# Patient Record
Sex: Male | Born: 1955 | Race: White | Hispanic: No | Marital: Married | Smoking: Never smoker
Health system: Southern US, Community
[De-identification: ages and names within clinical notes are randomized; demographics above are authoritative.]

## PROBLEM LIST (undated history)

## (undated) DIAGNOSIS — E7800 Pure hypercholesterolemia, unspecified: Secondary | ICD-10-CM

## (undated) HISTORY — PX: ROTATOR CUFF REPAIR: SHX139

---

## 2006-11-15 ENCOUNTER — Ambulatory Visit: Payer: Self-pay | Admitting: Gastroenterology

## 2008-01-02 ENCOUNTER — Ambulatory Visit: Payer: Self-pay

## 2008-03-08 ENCOUNTER — Ambulatory Visit: Payer: Self-pay | Admitting: Orthopaedic Surgery

## 2011-06-20 ENCOUNTER — Ambulatory Visit: Payer: Self-pay

## 2011-10-29 ENCOUNTER — Ambulatory Visit: Payer: Self-pay | Admitting: Orthopedic Surgery

## 2015-05-14 DIAGNOSIS — E7800 Pure hypercholesterolemia, unspecified: Secondary | ICD-10-CM | POA: Insufficient documentation

## 2015-08-08 ENCOUNTER — Encounter: Payer: Self-pay | Admitting: *Deleted

## 2015-08-09 ENCOUNTER — Ambulatory Visit
Admission: RE | Admit: 2015-08-09 | Discharge: 2015-08-09 | Disposition: A | Discharge status: Home / Self Care | Payer: BLUE CROSS/BLUE SHIELD | Source: Ambulatory Visit | Attending: Gastroenterology | Admitting: Gastroenterology

## 2015-08-09 ENCOUNTER — Ambulatory Visit: Payer: BLUE CROSS/BLUE SHIELD | Admitting: Anesthesiology

## 2015-08-09 ENCOUNTER — Encounter
Admission: RE | Disposition: A | Discharge status: Home / Self Care | Payer: Self-pay | Source: Ambulatory Visit | Attending: Gastroenterology

## 2015-08-09 ENCOUNTER — Encounter: Payer: Self-pay | Admitting: *Deleted

## 2015-08-09 DIAGNOSIS — E7800 Pure hypercholesterolemia, unspecified: Secondary | ICD-10-CM | POA: Diagnosis not present

## 2015-08-09 DIAGNOSIS — Z1211 Encounter for screening for malignant neoplasm of colon: Secondary | ICD-10-CM | POA: Diagnosis present

## 2015-08-09 DIAGNOSIS — K5730 Diverticulosis of large intestine without perforation or abscess without bleeding: Secondary | ICD-10-CM | POA: Diagnosis not present

## 2015-08-09 DIAGNOSIS — Z8371 Family history of colonic polyps: Secondary | ICD-10-CM | POA: Diagnosis not present

## 2015-08-09 HISTORY — DX: Pure hypercholesterolemia, unspecified: E78.00

## 2015-08-09 HISTORY — PX: COLONOSCOPY WITH PROPOFOL: SHX5780

## 2015-08-09 SURGERY — COLONOSCOPY WITH PROPOFOL
Anesthesia: General

## 2015-08-09 MED ORDER — PROPOFOL 500 MG/50ML IV EMUL
INTRAVENOUS | Status: DC | PRN
  Administered 2015-08-09: 180 ug/kg/min via INTRAVENOUS

## 2015-08-09 MED ORDER — SODIUM CHLORIDE 0.9 % IV SOLN
INTRAVENOUS | Status: DC
  Administered 2015-08-09: 1000 mL via INTRAVENOUS

## 2015-08-09 MED ORDER — LIDOCAINE HCL (CARDIAC) 20 MG/ML IV SOLN
INTRAVENOUS | Status: DC | PRN
  Administered 2015-08-09: 40 mg via INTRAVENOUS

## 2015-08-09 MED ORDER — SODIUM CHLORIDE 0.9 % IV SOLN: INTRAVENOUS | Status: DC

## 2015-08-09 MED ORDER — PROPOFOL 10 MG/ML IV BOLUS
INTRAVENOUS | Status: DC | PRN
  Administered 2015-08-09: 20 mg via INTRAVENOUS
  Administered 2015-08-09: 50 mg via INTRAVENOUS

## 2015-08-09 NOTE — Anesthesia Procedure Notes (Signed)
Performed by: Kniyah Khun Pre-anesthesia Checklist: Patient identified, Emergency Drugs available, Suction available, Patient being monitored and Timeout performed Patient Re-evaluated:Patient Re-evaluated prior to inductionOxygen Delivery Method: Nasal cannula Intubation Type: IV induction       

## 2015-08-09 NOTE — H&P (Signed)
    Primary Care Physician:  No primary care provider on file. Primary Gastroenterologist:  Dr. Bluford Kaufmann  Pre-Procedure History & Physical: HPI:  Barry Wilson. is a 59 y.o. male is here for an colonoscopy.   Past Medical History  Diagnosis Date  . Hypercholesteremia   . Hypercholesterolemia     Past Surgical History  Procedure Laterality Date  . Rotator cuff repair Left     Prior to Admission medications   Medication Sig Start Date End Date Taking? Authorizing Provider  simvastatin (ZOCOR) 40 MG tablet Take 40 mg by mouth daily.   Yes Historical Provider, MD    Allergies as of 06/19/2015  . (Not on File)    History reviewed. No pertinent family history.  Social History   Social History  . Marital Status: Married    Spouse Name: N/A  . Number of Children: N/A  . Years of Education: N/A   Occupational History  . Not on file.   Social History Main Topics  . Smoking status: Never Smoker   . Smokeless tobacco: Never Used  . Alcohol Use: Not on file  . Drug Use: Not on file  . Sexual Activity: Not on file   Other Topics Concern  . Not on file   Social History Narrative    Review of Systems: See HPI, otherwise negative ROS  Physical Exam: BP 118/68 mmHg  Pulse 63  Temp(Src) 96.1 F (35.6 C) (Tympanic)  Resp 16  Ht  (1.676 m)  Wt 88.451 kg (195 lb)  BMI 31.49 kg/m2  SpO2 98% General:   Alert,  pleasant and cooperative in NAD Head:  Normocephalic and atraumatic. Neck:  Supple; no masses or thyromegaly. Lungs:  Clear throughout to auscultation.    Heart:  Regular rate and rhythm. Abdomen:  Soft, nontender and nondistended. Normal bowel sounds, without guarding, and without rebound.   Neurologic:  Alert and  oriented x4;  grossly normal neurologically.  Impression/Plan: Barry Wilson. is here for an colonoscopy to be performed for screening, family hx of colon polyps  Risks, benefits, limitations, and alternatives regarding colonoscopy  have been reviewed with the patient.  Questions have been answered.  All parties agreeable.   Vail Vuncannon, Ezzard Standing, MD  08/09/2015, 7:55 AM

## 2015-08-09 NOTE — Op Note (Signed)
[facility] Patient Name: Barry Wilson Procedure Date: 08/09/2015 7:26 AM MRN: [id number] Account #: [id number] Date of Birth: Aug 03, 1956 Admit Type: Outpatient Age: 59 Room: [facility] ENDO ROOM 4 Gender: Male Note Status: Finalized Procedure:         Colonoscopy Indications:       Colon cancer screening in patient at increased risk:                     Family history of colon polyps Providers:         Ezzard Standing. Bluford Kaufmann, MD Referring MD:      Letta Pate. Danne Harbor, MD (Referring MD) Medicines:         Monitored Anesthesia Care Complications:     No immediate complications. Procedure:         Pre-Anesthesia Assessment:                    - Prior to the procedure, a History and Physical was                     performed, and patient medications, allergies and                     sensitivities were reviewed. The patient's tolerance of                     previous anesthesia was reviewed.                    - The risks and benefits of the procedure and the sedation                     options and risks were discussed with the patient. All                     questions were answered and informed consent was obtained.                    - After reviewing the risks and benefits, the patient was                     deemed in satisfactory condition to undergo the procedure.                    After obtaining informed consent, the colonoscope was                     passed under direct vision. Throughout the procedure, the                     patient's blood pressure, pulse, and oxygen saturations                     were monitored continuously. The Colonoscope was                     introduced through the anus and advanced to the the cecum,                     identified by appendiceal orifice and ileocecal valve. The                     colonoscopy was performed without difficulty. The patient  tolerated the procedure well. The quality of the bowel                      preparation was good. Findings:      A few small-mouthed diverticula were found in the sigmoid colon.      The exam was otherwise without abnormality. Impression:        - Diverticulosis in the sigmoid colon.                    - The examination was otherwise normal.                    - No specimens collected. Recommendation:    - Discharge patient to home.                    - Repeat colonoscopy in 5 years for surveillance.                    - The findings and recommendations were discussed with the                     patient. Procedure Code(s): --- Professional ---                    [phone], Colonoscopy, flexible; diagnostic, including                     collection of specimen(s) by brushing or washing, when                     performed (separate procedure) Diagnosis Code(s): --- Professional ---                    Z83.71, Family history of colonic polyps                    K57.30, Diverticulosis of large intestine without                     perforation or abscess without bleeding CPT copyright 2014 American Medical Association. All rights reserved. The codes documented in this report are preliminary and upon coder review may  be revised to meet current compliance requirements. Wallace Cullens, MD 08/09/2015 8:16:51 AM This report has been signed electronically. Number of Addenda: 0 Note Initiated On: 08/09/2015 7:26 AM Scope Withdrawal Time: 0 hours 6 minutes 18 seconds  Total Procedure Duration: 0 hours 9 minutes 5 seconds       [facility]

## 2015-08-09 NOTE — Anesthesia Postprocedure Evaluation (Signed)
  Anesthesia Post-op Note  Patient: Barry Wilson.  Procedure(s) Performed: Procedure(s): COLONOSCOPY WITH PROPOFOL (N/A)  Anesthesia type:General  Patient location: PACU  Post pain: Pain level controlled  Post assessment: Post-op Vital signs reviewed, Patient's Cardiovascular Status Stable, Respiratory Function Stable, Patent Airway and No signs of Nausea or vomiting  Post vital signs: Reviewed and stable  Last Vitals:  Filed Vitals:   08/09/15 0850  BP: 117/74  Pulse: 53  Temp:   Resp: 14    Level of consciousness: awake, alert  and patient cooperative  Complications: No apparent anesthesia complications

## 2015-08-09 NOTE — Anesthesia Preprocedure Evaluation (Signed)
Anesthesia Evaluation  Patient identified by MRN, date of birth, ID band Patient awake    Reviewed: Allergy & Precautions, H&P , NPO status , Patient's Chart, lab work & pertinent test results  History of Anesthesia Complications Negative for: history of anesthetic complications  Airway Mallampati: III  TM Distance: >3 FB Neck ROM: limited    Dental  (+) Poor Dentition, Chipped, Missing, Upper Dentures, Lower Dentures   Pulmonary neg pulmonary ROS, neg shortness of breath,    Pulmonary exam normal breath sounds clear to auscultation       Cardiovascular Exercise Tolerance: Good (-) Past MI negative cardio ROS Normal cardiovascular exam Rhythm:regular Rate:Normal     Neuro/Psych negative neurological ROS  negative psych ROS   GI/Hepatic negative GI ROS, Neg liver ROS, neg GERD  ,  Endo/Other  negative endocrine ROS  Renal/GU negative Renal ROS  negative genitourinary   Musculoskeletal   Abdominal   Peds  Hematology negative hematology ROS (+)   Anesthesia Other Findings Past Medical History:   Hypercholesteremia                                                                           Signs and symptoms suggestive of sleep apnea    Reproductive/Obstetrics negative OB ROS                             Anesthesia Physical Anesthesia Plan  ASA: III  Anesthesia Plan: General   Post-op Pain Management:    Induction:   Airway Management Planned:   Additional Equipment:   Intra-op Plan:   Post-operative Plan:   Informed Consent: I have reviewed the patients History and Physical, chart, labs and discussed the procedure including the risks, benefits and alternatives for the proposed anesthesia with the patient or authorized representative who has indicated his/her understanding and acceptance.   Dental Advisory Given  Plan Discussed with: Anesthesiologist, CRNA and  Surgeon  Anesthesia Plan Comments:         Anesthesia Quick Evaluation

## 2015-08-09 NOTE — Transfer of Care (Signed)
Immediate Anesthesia Transfer of Care Note  Patient: Barry Wilson.  Procedure(s) Performed: Procedure(s): COLONOSCOPY WITH PROPOFOL (N/A)  Patient Location: PACU  Anesthesia Type:General  Level of Consciousness: awake, alert  and oriented  Airway & Oxygen Therapy: Patient Spontanous Breathing and Patient connected to nasal cannula oxygen  Post-op Assessment: Report given to RN and Post -op Vital signs reviewed and stable  Post vital signs: Reviewed and stable  Last Vitals:  Filed Vitals:   08/09/15 0657  BP: 118/68  Pulse: 63  Temp: 35.6 C  Resp: 16    Complications: No apparent anesthesia complications

## 2015-08-12 ENCOUNTER — Encounter: Payer: Self-pay | Admitting: Gastroenterology

## 2017-01-11 ENCOUNTER — Other Ambulatory Visit: Payer: Self-pay | Admitting: Gastroenterology

## 2017-01-11 DIAGNOSIS — R1011 Right upper quadrant pain: Secondary | ICD-10-CM

## 2017-01-14 ENCOUNTER — Ambulatory Visit
Admission: RE | Admit: 2017-01-14 | Discharge: 2017-01-14 | Disposition: A | Discharge status: Home / Self Care | Payer: BLUE CROSS/BLUE SHIELD | Source: Ambulatory Visit | Attending: Gastroenterology | Admitting: Gastroenterology

## 2017-01-14 DIAGNOSIS — K8020 Calculus of gallbladder without cholecystitis without obstruction: Secondary | ICD-10-CM | POA: Insufficient documentation

## 2017-01-14 DIAGNOSIS — R932 Abnormal findings on diagnostic imaging of liver and biliary tract: Secondary | ICD-10-CM | POA: Diagnosis not present

## 2017-01-14 DIAGNOSIS — R1011 Right upper quadrant pain: Secondary | ICD-10-CM

## 2017-01-22 ENCOUNTER — Encounter: Payer: Self-pay | Admitting: Internal Medicine

## 2017-01-22 ENCOUNTER — Emergency Department: Payer: BLUE CROSS/BLUE SHIELD

## 2017-01-22 ENCOUNTER — Inpatient Hospital Stay: Payer: BLUE CROSS/BLUE SHIELD

## 2017-01-22 ENCOUNTER — Inpatient Hospital Stay
Admission: EM | Admit: 2017-01-22 | Discharge: 2017-01-25 | DRG: 417 | Disposition: A | Discharge status: Home / Self Care | Payer: BLUE CROSS/BLUE SHIELD | Attending: Internal Medicine | Admitting: Internal Medicine

## 2017-01-22 DIAGNOSIS — K8062 Calculus of gallbladder and bile duct with acute cholecystitis without obstruction: Secondary | ICD-10-CM | POA: Diagnosis present

## 2017-01-22 DIAGNOSIS — K219 Gastro-esophageal reflux disease without esophagitis: Secondary | ICD-10-CM | POA: Diagnosis present

## 2017-01-22 DIAGNOSIS — Z79899 Other long term (current) drug therapy: Secondary | ICD-10-CM

## 2017-01-22 DIAGNOSIS — K828 Other specified diseases of gallbladder: Secondary | ICD-10-CM | POA: Diagnosis present

## 2017-01-22 DIAGNOSIS — E7800 Pure hypercholesterolemia, unspecified: Secondary | ICD-10-CM | POA: Diagnosis present

## 2017-01-22 DIAGNOSIS — K660 Peritoneal adhesions (postprocedural) (postinfection): Secondary | ICD-10-CM | POA: Diagnosis present

## 2017-01-22 DIAGNOSIS — K8070 Calculus of gallbladder and bile duct without cholecystitis without obstruction: Secondary | ICD-10-CM

## 2017-01-22 DIAGNOSIS — E785 Hyperlipidemia, unspecified: Secondary | ICD-10-CM | POA: Diagnosis present

## 2017-01-22 DIAGNOSIS — R1110 Vomiting, unspecified: Secondary | ICD-10-CM | POA: Diagnosis present

## 2017-01-22 DIAGNOSIS — Z7982 Long term (current) use of aspirin: Secondary | ICD-10-CM | POA: Diagnosis not present

## 2017-01-22 DIAGNOSIS — R066 Hiccough: Secondary | ICD-10-CM | POA: Diagnosis not present

## 2017-01-22 DIAGNOSIS — K2980 Duodenitis without bleeding: Secondary | ICD-10-CM | POA: Diagnosis present

## 2017-01-22 DIAGNOSIS — K8689 Other specified diseases of pancreas: Secondary | ICD-10-CM | POA: Diagnosis present

## 2017-01-22 DIAGNOSIS — R109 Unspecified abdominal pain: Secondary | ICD-10-CM

## 2017-01-22 DIAGNOSIS — K851 Biliary acute pancreatitis without necrosis or infection: Secondary | ICD-10-CM | POA: Diagnosis present

## 2017-01-22 DIAGNOSIS — R1011 Right upper quadrant pain: Secondary | ICD-10-CM

## 2017-01-22 LAB — COMPREHENSIVE METABOLIC PANEL
ALK PHOS: 83 U/L (ref 38–126)
ALT: 59 U/L (ref 17–63)
ALT: 61 U/L (ref 17–63)
ANION GAP: 7 (ref 5–15)
AST: 88 U/L — AB (ref 15–41)
AST: 65 U/L — ABNORMAL HIGH (ref 15–41)
Albumin: 4.3 g/dL (ref 3.5–5.0)
Albumin: 4.1 g/dL (ref 3.5–5.0)
Alkaline Phosphatase: 79 U/L (ref 38–126)
Anion gap: 8 (ref 5–15)
BILIRUBIN TOTAL: 0.9 mg/dL (ref 0.3–1.2)
BUN: 12 mg/dL (ref 6–20)
BUN: 12 mg/dL (ref 6–20)
CALCIUM: 8.8 mg/dL — AB (ref 8.9–10.3)
CHLORIDE: 106 mmol/L (ref 101–111)
CHLORIDE: 106 mmol/L (ref 101–111)
CO2: 25 mmol/L (ref 22–32)
CO2: 28 mmol/L (ref 22–32)
CREATININE: 0.91 mg/dL (ref 0.61–1.24)
Calcium: 8.6 mg/dL — ABNORMAL LOW (ref 8.9–10.3)
Creatinine, Ser: 0.81 mg/dL (ref 0.61–1.24)
GFR calc Af Amer: >60 mL/min (ref >60)
GFR calc non Af Amer: >60 mL/min (ref >60)
GLUCOSE: 149 mg/dL — AB (ref 65–99)
Glucose, Bld: 138 mg/dL — ABNORMAL HIGH (ref 65–99)
POTASSIUM: 3.9 mmol/L (ref 3.5–5.1)
Potassium: 3.8 mmol/L (ref 3.5–5.1)
SODIUM: 139 mmol/L (ref 135–145)
Sodium: 141 mmol/L (ref 135–145)
Total Bilirubin: 1 mg/dL (ref 0.3–1.2)
Total Protein: 7.2 g/dL (ref 6.5–8.1)
Total Protein: 7 g/dL (ref 6.5–8.1)

## 2017-01-22 LAB — CBC WITH DIFFERENTIAL/PLATELET
BASOS ABS: 0 10*3/uL (ref 0–0.1)
BASOS PCT: 0 %
EOS ABS: 0.1 10*3/uL (ref 0–0.7)
Eosinophils Relative: 1 %
HEMATOCRIT: 42.7 % (ref 40.0–52.0)
HEMOGLOBIN: 14.7 g/dL (ref 13.0–18.0)
Lymphocytes Relative: 10 %
Lymphs Abs: 1.1 10*3/uL (ref 1.0–3.6)
MCH: 30.4 pg (ref 26.0–34.0)
MCHC: 34.4 g/dL (ref 32.0–36.0)
MCV: 88.4 fL (ref 80.0–100.0)
MONOS PCT: 8 %
Monocytes Absolute: 0.8 10*3/uL (ref 0.2–1.0)
NEUTROS PCT: 81 %
Neutro Abs: 8.8 10*3/uL — ABNORMAL HIGH (ref 1.4–6.5)
Platelets: 208 10*3/uL (ref 150–440)
RBC: 4.83 MIL/uL (ref 4.40–5.90)
RDW: 13.2 % (ref 11.5–14.5)
WBC: 10.8 10*3/uL — AB (ref 3.8–10.6)

## 2017-01-22 LAB — LIPASE, BLOOD: Lipase: 3122 U/L — ABNORMAL HIGH (ref 11–51)

## 2017-01-22 LAB — TSH: TSH: 1.55 u[IU]/mL (ref 0.350–4.500)

## 2017-01-22 MED ORDER — OXYCODONE HCL 5 MG PO TABS
5.0000 mg | ORAL_TABLET | ORAL | Status: DC | PRN
  Administered 2017-01-22 – 2017-01-24 (×4): 5 mg via ORAL
  Filled 2017-01-22 (×4): qty 1

## 2017-01-22 MED ORDER — ONDANSETRON HCL 4 MG/2ML IJ SOLN
INTRAMUSCULAR | Status: AC
  Administered 2017-01-22: 4 mg via INTRAVENOUS
  Filled 2017-01-22: qty 2

## 2017-01-22 MED ORDER — MORPHINE SULFATE (PF) 2 MG/ML IV SOLN
2.0000 mg | Freq: Once | INTRAVENOUS | Status: AC
  Administered 2017-01-22: 2 mg via INTRAVENOUS
  Filled 2017-01-22: qty 1

## 2017-01-22 MED ORDER — DOCUSATE SODIUM 100 MG PO CAPS
100.0000 mg | ORAL_CAPSULE | Freq: Two times a day (BID) | ORAL | Status: DC
  Administered 2017-01-22 – 2017-01-25 (×6): 100 mg via ORAL
  Filled 2017-01-22 (×6): qty 1

## 2017-01-22 MED ORDER — HYDROMORPHONE HCL 1 MG/ML IJ SOLN
1.0000 mg | INTRAMUSCULAR | Status: AC
  Administered 2017-01-22: 1 mg via INTRAVENOUS
  Filled 2017-01-22: qty 1

## 2017-01-22 MED ORDER — MORPHINE SULFATE (PF) 4 MG/ML IV SOLN: 4.0000 mg | Freq: Once | INTRAVENOUS | Status: DC

## 2017-01-22 MED ORDER — POTASSIUM CHLORIDE IN NACL 20-0.9 MEQ/L-% IV SOLN
INTRAVENOUS | Status: DC
  Administered 2017-01-22 – 2017-01-24 (×7): via INTRAVENOUS
  Filled 2017-01-22 (×10): qty 1000

## 2017-01-22 MED ORDER — FAMOTIDINE 20 MG PO TABS
20.0000 mg | ORAL_TABLET | Freq: Two times a day (BID) | ORAL | Status: DC
  Administered 2017-01-22 – 2017-01-25 (×6): 20 mg via ORAL
  Filled 2017-01-22 (×6): qty 1

## 2017-01-22 MED ORDER — SIMVASTATIN 40 MG PO TABS
40.0000 mg | ORAL_TABLET | Freq: Every day | ORAL | Status: DC
  Administered 2017-01-22 – 2017-01-24 (×3): 40 mg via ORAL
  Filled 2017-01-22 (×3): qty 1

## 2017-01-22 MED ORDER — ADULT MULTIVITAMIN W/MINERALS CH
1.0000 | ORAL_TABLET | Freq: Every day | ORAL | Status: DC
  Administered 2017-01-24 – 2017-01-25 (×2): 1 via ORAL
  Filled 2017-01-22 (×2): qty 1

## 2017-01-22 MED ORDER — ONDANSETRON HCL 4 MG PO TABS
4.0000 mg | ORAL_TABLET | Freq: Four times a day (QID) | ORAL | Status: DC | PRN
  Administered 2017-01-25: 4 mg via ORAL
  Filled 2017-01-22: qty 1

## 2017-01-22 MED ORDER — HYDROMORPHONE HCL 1 MG/ML IJ SOLN
1.0000 mg | INTRAMUSCULAR | Status: DC | PRN
  Administered 2017-01-22 – 2017-01-24 (×12): 1 mg via INTRAVENOUS
  Filled 2017-01-22 (×12): qty 1

## 2017-01-22 MED ORDER — SIMETHICONE 80 MG PO CHEW
80.0000 mg | CHEWABLE_TABLET | Freq: Four times a day (QID) | ORAL | Status: DC | PRN
  Administered 2017-01-22: 80 mg via ORAL
  Filled 2017-01-22: qty 1

## 2017-01-22 MED ORDER — ONDANSETRON HCL 4 MG/2ML IJ SOLN
4.0000 mg | Freq: Once | INTRAMUSCULAR | Status: AC
  Administered 2017-01-22: 4 mg via INTRAVENOUS

## 2017-01-22 MED ORDER — ACETAMINOPHEN 650 MG RE SUPP: 650.0000 mg | Freq: Four times a day (QID) | RECTAL | Status: DC | PRN

## 2017-01-22 MED ORDER — GADOBENATE DIMEGLUMINE 529 MG/ML IV SOLN
15.0000 mL | Freq: Once | INTRAVENOUS | Status: AC | PRN
  Administered 2017-01-22: 15 mL via INTRAVENOUS

## 2017-01-22 MED ORDER — SODIUM CHLORIDE 0.9% FLUSH
3.0000 mL | Freq: Two times a day (BID) | INTRAVENOUS | Status: DC
  Administered 2017-01-23 – 2017-01-25 (×3): 3 mL via INTRAVENOUS

## 2017-01-22 MED ORDER — ACETAMINOPHEN 325 MG PO TABS: 650.0000 mg | ORAL_TABLET | Freq: Four times a day (QID) | ORAL | Status: DC | PRN

## 2017-01-22 MED ORDER — LACTATED RINGERS IV BOLUS (SEPSIS)
1000.0000 mL | Freq: Once | INTRAVENOUS | Status: AC
  Administered 2017-01-22: 1000 mL via INTRAVENOUS

## 2017-01-22 MED ORDER — PIPERACILLIN-TAZOBACTAM 3.375 G IVPB
3.3750 g | Freq: Once | INTRAVENOUS | Status: AC
  Administered 2017-01-22: 3.375 g via INTRAVENOUS
  Filled 2017-01-22: qty 50

## 2017-01-22 MED ORDER — SODIUM CHLORIDE 0.9 % IV SOLN
INTRAVENOUS | Status: DC
  Administered 2017-01-22: 08:00:00 via INTRAVENOUS

## 2017-01-22 MED ORDER — PIPERACILLIN-TAZOBACTAM 3.375 G IVPB
3.3750 g | Freq: Three times a day (TID) | INTRAVENOUS | Status: DC
  Administered 2017-01-22 – 2017-01-24 (×6): 3.375 g via INTRAVENOUS
  Filled 2017-01-22 (×6): qty 50

## 2017-01-22 MED ORDER — ONDANSETRON HCL 4 MG/2ML IJ SOLN
4.0000 mg | Freq: Four times a day (QID) | INTRAMUSCULAR | Status: DC | PRN
  Administered 2017-01-22 (×2): 4 mg via INTRAVENOUS
  Filled 2017-01-22 (×3): qty 2

## 2017-01-22 NOTE — Progress Notes (Addendum)
[facility] - [place] at [facility]   PATIENT NAME: Barry Wilson    MR#:  [id number]  DATE OF BIRTH:  17-Mar-1956  SUBJECTIVE:  CHIEF COMPLAINT:   Chief Complaint  Patient presents with  . Emesis  Patient is a 61 year old Caucasian male with medical history significant for history of hyperlipidemia, who presents to the hospital with complaints of abdominal pain, nausea and vomiting. Pain was described as excruciating right upper quadrant and midepigastric abdominal pain. Right upper quadrant ultrasound revealed enlarged common bile duct with possible common bile duct stone. Lipase level was elevated, patient was admitted. MRCP was performed today which revealed no stone. Patient was seen by surgeon, who recommended cholecystectomy after acute pancreatitis is resolved. Patient feels uncomfortable, complains of epigastric abdominal pain, right upper quadrant abdominal pain, minimal improvement with pain medications at present, less nausea  Review of Systems  Constitutional: Negative for chills, fever and weight loss.  HENT: Negative for congestion.   Eyes: Negative for blurred vision and double vision.  Respiratory: Negative for cough, sputum production, shortness of breath and wheezing.   Cardiovascular: Negative for chest pain, palpitations, orthopnea, leg swelling and PND.  Gastrointestinal: Positive for abdominal pain, nausea and vomiting. Negative for blood in stool, constipation and diarrhea.  Genitourinary: Negative for dysuria, frequency, hematuria and urgency.  Musculoskeletal: Negative for falls.  Neurological: Negative for dizziness, tremors, focal weakness and headaches.  Endo/Heme/Allergies: Does not bruise/bleed easily.  Psychiatric/Behavioral: Negative for depression. The patient does not have insomnia.     VITAL SIGNS: Blood pressure 130/69, pulse 65, temperature 98.6 F (37 C), temperature source Oral, resp. rate 20, height 5\' 6"  (1.676 m),  weight 87.8 kg (193 lb 9.6 oz), SpO2 98 %.  PHYSICAL EXAMINATION:   GENERAL:  61 y.o.-year-old patient lying in the bed in mild to moderate distress due to epigastric abdominal discomfort.  EYES: Pupils equal, round, reactive to light and accommodation. No scleral icterus. Extraocular muscles intact.  HEENT: Head atraumatic, normocephalic. Oropharynx and nasopharynx clear.  NECK:  Supple, no jugular venous distention. No thyroid enlargement, no tenderness.  LUNGS: Normal breath sounds bilaterally, no wheezing, rales,rhonchi or crepitation. No use of accessory muscles of respiration.  CARDIOVASCULAR: S1, S2 normal. No murmurs, rubs, or gallops.  ABDOMEN: Soft, tender in upper abdomen, mostly in the epigastric area, right upper quadrant, some guarding was noted, Murphy sign,, nondistended. Bowel sounds present, diminished. No organomegaly or mass.  EXTREMITIES: No pedal edema, cyanosis, or clubbing.  NEUROLOGIC: Cranial nerves II through XII are intact. Muscle strength 5/5 in all extremities. Sensation intact. Gait not checked.  PSYCHIATRIC: The patient is alert and oriented x 3.  SKIN: No obvious rash, lesion, or ulcer.   ORDERS/RESULTS REVIEWED:   CBC  Recent Labs Lab 01/22/17 0247  WBC 10.8*  HGB 14.7  HCT 42.7  PLT 208  MCV 88.4  MCH 30.4  MCHC 34.4  RDW 13.2  LYMPHSABS 1.1  MONOABS 0.8  EOSABS 0.1  BASOSABS 0.0   ------------------------------------------------------------------------------------------------------------------  Chemistries   Recent Labs Lab 01/22/17 0247 01/22/17 0813  NA 139 141  K 3.8 3.9  CL 106 106  CO2 25 28  GLUCOSE 149* 138*  BUN 12 12  CREATININE 0.91 0.81  CALCIUM 8.8* 8.6*  AST 88* 65*  ALT 59 61  ALKPHOS 83 79  BILITOT 1.0 0.9   ------------------------------------------------------------------------------------------------------------------ estimated creatinine clearance is 98.2 mL/min (by C-G formula based on SCr of 0.81  mg/dL). ------------------------------------------------------------------------------------------------------------------  Recent Labs  01/22/17  0813  TSH 1.550    Cardiac Enzymes No results for input(s): CKMB, TROPONINI, MYOGLOBIN in the last 168 hours.  Invalid input(s): CK ------------------------------------------------------------------------------------------------------------------ Invalid input(s): POCBNP ---------------------------------------------------------------------------------------------------------------  RADIOLOGY: Mr 3d Recon At Scanner  Result Date: 01/22/2017 CLINICAL DATA:  Cholelithiasis with right upper quadrant pain and potential common bile duct stones seen on ultrasound. EXAM: MRI ABDOMEN WITHOUT AND WITH CONTRAST (INCLUDING MRCP) TECHNIQUE: Multiplanar multisequence MR imaging of the abdomen was performed both before and after the administration of intravenous contrast. Heavily T2-weighted images of the biliary and pancreatic ducts were obtained, and three-dimensional MRCP images were rendered by post processing. CONTRAST:  15mL MULTIHANCE GADOBENATE DIMEGLUMINE 529 MG/ML IV SOLN COMPARISON:  Ultrasound exam from earlier today. FINDINGS: Lower chest:  Dependent atelectasis. Hepatobiliary: No focal abnormality in the liver parenchyma. Geographic fatty deposition noted within the liver parenchyma. Gallbladder is distended with tiny gallstones evident. Trace prominence intrahepatic bile ducts is associated with 10 mm diameter extrahepatic common duct. Common bile duct in the head of the pancreas measures 7-8 mm. No filling defect in the common duct or common bile duct to suggest choledocholithiasis. Pancreas: Pancreatic parenchyma shows diffuse edema in there is peripancreatic edema/inflammation. Main pancreatic duct is mildly distended. Pancreas enhances throughout. No focal mass lesion within the pancreatic parenchyma. Spleen: No splenomegaly. No focal mass lesion.  Adrenals/Urinary Tract: No adrenal nodule or mass. Kidneys are unremarkable. Stomach/Bowel: Stomach is nondistended. No gastric wall thickening. No evidence of outlet obstruction. Duodenum is normally positioned as is the ligament of Treitz. Visualize small bowel loops and colonic segments of the abdomen are unremarkable. Vascular/Lymphatic: No evidence for abdominal aortic aneurysm. Other: Edema is identified in the anterior pararenal space. Musculoskeletal: No abnormal marrow enhancement within the visualized bony anatomy. IMPRESSION: 1. Edema within and around the pancreatic parenchyma, consistent with pancreatitis. Pancreatic parenchyma enhances throughout. 2. Cholelithiasis without choledocholithiasis. 3. Mild extrahepatic biliary duct dilatation. Electronically Signed   By: [name] M.D.   On: 01/22/2017 10:14   Mr Abdomen Mrcp Vivien Rossetti Contast  Result Date: 01/22/2017 CLINICAL DATA:  Cholelithiasis with right upper quadrant pain and potential common bile duct stones seen on ultrasound. EXAM: MRI ABDOMEN WITHOUT AND WITH CONTRAST (INCLUDING MRCP) TECHNIQUE: Multiplanar multisequence MR imaging of the abdomen was performed both before and after the administration of intravenous contrast. Heavily T2-weighted images of the biliary and pancreatic ducts were obtained, and three-dimensional MRCP images were rendered by post processing. CONTRAST:  15mL MULTIHANCE GADOBENATE DIMEGLUMINE 529 MG/ML IV SOLN COMPARISON:  Ultrasound exam from earlier today. FINDINGS: Lower chest:  Dependent atelectasis. Hepatobiliary: No focal abnormality in the liver parenchyma. Geographic fatty deposition noted within the liver parenchyma. Gallbladder is distended with tiny gallstones evident. Trace prominence intrahepatic bile ducts is associated with 10 mm diameter extrahepatic common duct. Common bile duct in the head of the pancreas measures 7-8 mm. No filling defect in the common duct or common bile duct to suggest  choledocholithiasis. Pancreas: Pancreatic parenchyma shows diffuse edema in there is peripancreatic edema/inflammation. Main pancreatic duct is mildly distended. Pancreas enhances throughout. No focal mass lesion within the pancreatic parenchyma. Spleen: No splenomegaly. No focal mass lesion. Adrenals/Urinary Tract: No adrenal nodule or mass. Kidneys are unremarkable. Stomach/Bowel: Stomach is nondistended. No gastric wall thickening. No evidence of outlet obstruction. Duodenum is normally positioned as is the ligament of Treitz. Visualize small bowel loops and colonic segments of the abdomen are unremarkable. Vascular/Lymphatic: No evidence for abdominal aortic aneurysm. Other: Edema is identified in the anterior pararenal  space. Musculoskeletal: No abnormal marrow enhancement within the visualized bony anatomy. IMPRESSION: 1. Edema within and around the pancreatic parenchyma, consistent with pancreatitis. Pancreatic parenchyma enhances throughout. 2. Cholelithiasis without choledocholithiasis. 3. Mild extrahepatic biliary duct dilatation. Electronically Signed   By: Kennith CenterEric  Mansell M.D.   On: 01/22/2017 10:14   [place] Abdomen Limited Ruq  Result Date: 01/22/2017 CLINICAL DATA:  61 year old male with right upper quadrant abdominal pain and vomiting. EXAM: US ABDOMEN LIMITED - RIGHT UPPER QUADRANT COMPARISON:  Abdominal ultrasound dated 01/14/2017 FINDINGS: Gallbladder: The gallbladder is mildly distended. There multiple stones within the gallbladder. No gallbladder wall thickening or pericholecystic fluid. Positive sonographic Murphy's sign. Common bile duct: Diameter: 9 mm (previously 5 mm). A 3 mm faint echogenic focus in the central CBD may represent a stone. Liver: Diffuse fatty infiltration of the liver. IMPRESSION: 1. Cholelithiasis with positive sonographic Murphy's sign and mild dilatation of the CBD. A 3 mm central CBD stone may be present. There is however no gallbladder wall thickening or pericholecystic  fluid or other definite sonographic features of acute cholecystitis. A hepatobiliary scintigraphy may provide better evaluation of the gallbladder if there is a high clinical concern for acute cholecystitis . 2. Fatty liver. Electronically Signed   By: Elgie CollardArash  Radparvar M.D.   On: 01/22/2017 03:51    EKG: No orders found for this or any previous visit.  ASSESSMENT AND PLAN:  Active Problems:   Gallstone pancreatitis   Acute biliary pancreatitis  #1. Acute gallstone pancreatitis, MRCP revealed passed stone, continue IV fluids and hydrate, following lipase level in the morning, pain medications, nothing by mouth, appreciate the gastroenterologist input #2. Acute cholecystitis with numerous gallstones, continue Zosyn, surgical consultation was requested, surgeon felt the patient may benefit from cholecystectomy after acute pancreatitis improves, discussed with Dr. Everlene FarrierPabon #3. Elevated transaminases due to cholecystitis, follow closely, seems to be improving #4. Leukocytosis, follow with antibiotic therapy Management plans discussed with the patient, family and they are in agreement.   DRUG ALLERGIES: No Known Allergies  CODE STATUS:     Code Status Orders        Start     Ordered   01/22/17 0728  Full code  Continuous     01/22/17 0727    Code Status History    Date Active Date Inactive Code Status Order ID Comments User Context   This patient has a current code status but no historical code status.      TOTAL TIME TAKING CARE OF THIS PATIENT: 40 minutes.  Discussed with patient, his wife, Dr. Florencia ReasonsPabon  Maisee Vollman M.D on 01/22/2017 at 3:30 PM  Between 7am to 6pm - Pager - [phone]  After 6pm go to [email or web address] - password [email or web address] [facility]  [facility]  Office  [phone]  CC: Primary care physician; Rafael BihariWALKER III, [name], MD

## 2017-01-22 NOTE — Consult Note (Signed)
Patient with gall stone pancreatitis with signif improvement since last night.  He may have his gall bladder removed tomorrow.  See Kai LevinsMichel Johnson's note for details.  I discussed pancreatitis and cause for pain and some episodes can be very serious in some people.  He drinks 1-2 beers not infrequently and I advised him to abstain for a few months at least.  We will follow until surgery then likely sign off.

## 2017-01-22 NOTE — ED Provider Notes (Signed)
[facility] Emergency Department Provider Note   First MD Initiated Contact with Patient 01/22/17 [phone]     (approximate)  I have reviewed the triage vital signs and the nursing notes.   HISTORY  Chief Complaint Emesis    HPI Barry Wilson. is a 61 y.o. male with bolus of chronic medical conditions including known gallstones presents to the emergency department with epigastric abdominal pain and vomiting since 6 PM yesterday. Patient states that he was scheduled to see Dr. Katrinka Blazing general surgeon for cholecystectomy next Monday. Patient states initial pain was intense and then subsequently subsided and a such she had dinner at 8:30 PM with return of pain approximately 10 PM accompanied by vomiting. Patient states his current pain score is 7 out of 10. Patient denies any fever. She denies any diarrhea or constipation. Patient denies any urinary symptoms.   Past Medical History:  Diagnosis Date  . Hypercholesteremia   . Hypercholesterolemia     Patient Active Problem List   Diagnosis Date Noted  . Gallstone pancreatitis 01/22/2017    Past Surgical History:  Procedure Laterality Date  . COLONOSCOPY WITH PROPOFOL N/A 08/09/2015   Procedure: COLONOSCOPY WITH PROPOFOL;  Surgeon: Wallace Cullens, MD;  Location: [facility] ENDOSCOPY;  Service: Gastroenterology;  Laterality: N/A;  . ROTATOR CUFF REPAIR Left     Prior to Admission medications   Medication Sig Start Date End Date Taking? Authorizing Provider  aspirin EC 81 MG tablet Take 81 mg by mouth daily.   Yes Historical Provider, MD  Multiple Vitamin (MULTIVITAMIN WITH MINERALS) TABS tablet Take 1 tablet by mouth daily.   Yes Historical Provider, MD  omeprazole (PRILOSEC) 40 MG capsule Take 40 mg by mouth daily.   Yes Historical Provider, MD  ranitidine (ZANTAC) 150 MG tablet Take 150 mg by mouth 2 (two) times daily.   Yes Historical Provider, MD  simvastatin (ZOCOR) 40 MG tablet Take 40 mg by mouth daily.   Yes  Historical Provider, MD    Allergies Patient has no known allergies.  History reviewed. No pertinent family history.  Social History Social History  Substance Use Topics  . Smoking status: Never Smoker  . Smokeless tobacco: Never Used  . Alcohol use Not on file    Review of Systems Constitutional: No fever/chills Eyes: No visual changes. ENT: No sore throat. Cardiovascular: Denies chest pain. Respiratory: Denies shortness of breath. Gastrointestinal: Positive for abdominal pain and vomiting  Genitourinary: Negative for dysuria. Musculoskeletal: Negative for back pain. Skin: Negative for rash. Neurological: Negative for headaches, focal weakness or numbness. } 10-point ROS otherwise negative.  ____________________________________________   PHYSICAL EXAM:  VITAL SIGNS: ED Triage Vitals [01/22/17 0228]  Enc Vitals Group     BP (!) 158/72     Pulse Rate 60     Resp 18     Temp 98.6 F (37 C)     Temp Source Oral     SpO2 100 %     Weight 193 lb (87.5 kg)     Height 5\' 6"  (1.676 m)     Head Circumference      Peak Flow      Pain Score 7     Pain Loc      Pain Edu?      Excl. in GC?     Constitutional: Alert and oriented. Well appearing and in no acute distress. Eyes: Conjunctivae are normal. PERRL. EOMI. Head: Atraumatic.Marland Kitchen Mouth/Throat: Mucous membranes are moist. Oropharynx non-erythematous. Neck:  No stridor.   Cardiovascular: Normal rate, regular rhythm. Good peripheral circulation. Grossly normal heart sounds. Respiratory: Normal respiratory effort.  No retractions. Lungs CTAB. Gastrointestinal: Right upper quadrant tenderness to palpation.. No distention.  Musculoskeletal: No lower extremity tenderness nor edema. No gross deformities of extremities. Neurologic:  Normal speech and language. No gross focal neurologic deficits are appreciated.  Skin:  Skin is warm, dry and intact. No rash noted. Psychiatric: Mood and affect are normal. Speech and behavior  are normal.  ____________________________________________   LABS (all labs ordered are listed, but only abnormal results are displayed)  Labs Reviewed  CBC WITH DIFFERENTIAL/PLATELET - Abnormal; Notable for the following:       Result Value   WBC 10.8 (*)    Neutro Abs 8.8 (*)    All other components within normal limits  COMPREHENSIVE METABOLIC PANEL - Abnormal; Notable for the following:    Glucose, Bld 149 (*)    Calcium 8.8 (*)    AST 88 (*)    All other components within normal limits  LIPASE, BLOOD - Abnormal; Notable for the following:    Lipase 3,122 (*)    All other components within normal limits     RADIOLOGY I, [place] N BROWN, personally viewed and evaluated these images (plain radiographs) as part of my medical decision making, as well as reviewing the written report by the radiologist. US Abdomen Limited Ruq  Result Date: 01/22/2017 CLINICAL DATA:  61 year old male with right upper quadrant abdominal pain and vomiting. EXAM: US ABDOMEN LIMITED - RIGHT UPPER QUADRANT COMPARISON:  Abdominal ultrasound dated 01/14/2017 FINDINGS: Gallbladder: The gallbladder is mildly distended. There multiple stones within the gallbladder. No gallbladder wall thickening or pericholecystic fluid. Positive sonographic Murphy's sign. Common bile duct: Diameter: 9 mm (previously 5 mm). A 3 mm faint echogenic focus in the central CBD may represent a stone. Liver: Diffuse fatty infiltration of the liver. IMPRESSION: 1. Cholelithiasis with positive sonographic Murphy's sign and mild dilatation of the CBD. A 3 mm central CBD stone may be present. There is however no gallbladder wall thickening or pericholecystic fluid or other definite sonographic features of acute cholecystitis. A hepatobiliary scintigraphy may provide better evaluation of the gallbladder if there is a high clinical concern for acute cholecystitis . 2. Fatty liver. Electronically Signed   By: Elgie Collard M.D.   On: 01/22/2017  03:51      Procedures   ____________________________________________   INITIAL IMPRESSION / ASSESSMENT AND PLAN / ED COURSE  Pertinent labs & imaging results that were available during my care of the patient were reviewed by me and considered in my medical decision making (see chart for details).  64-year-old male presenting with persistent right upper quadrant abdominal pain associated vomiting with known history of cholelithiasis. Clinical exam concerning for possible cholecystitis versus gallstone pancreatitis. Laboratory data revealed lipase of 3122. Ultrasound revealed a 3 mm stone noted in the common bile duct.      ____________________________________________  FINAL CLINICAL IMPRESSION(S) / ED DIAGNOSES  Final diagnoses:  Vomiting  RUQ pain  Acute biliary pancreatitis, unspecified complication status  Calculus of gallbladder and bile duct without cholecystitis or obstruction     MEDICATIONS GIVEN DURING THIS VISIT:  Medications  HYDROmorphone (DILAUDID) injection 1 mg (not administered)  ondansetron (ZOFRAN) injection 4 mg (4 mg Intravenous Given 01/22/17 0255)  morphine 2 MG/ML injection 2 mg (2 mg Intravenous Given 01/22/17 0405)  morphine 2 MG/ML injection 2 mg (2 mg Intravenous Given 01/22/17 0447)  NEW OUTPATIENT MEDICATIONS STARTED DURING THIS VISIT:  New Prescriptions   No medications on file    Modified Medications   No medications on file    Discontinued Medications   No medications on file     Note:  This document was prepared using Dragon voice recognition software and may include unintentional dictation errors.    Darci Currentandolph N Brown, MD 01/22/17 [phone]

## 2017-01-22 NOTE — Progress Notes (Signed)
Patient is saying he is belching and it is causing more pain in his abdomen.  Dr pabon ordered simethicone

## 2017-01-22 NOTE — H&P (Signed)
Barry Wilson. is an 61 y.o. male.   Chief Complaint: Nausea and vomiting HPI: The patient with past medical history of hyperlipidemia presents to the emergency department complaining of nausea and vomiting. Both began concurrently with excruciating right upper quadrant to mid epigastric abdominal pain. The pain began approximately 12 hours prior to admission. His emesis was nonbloody but potentially bilious. This is not the first time he has had similar pain. He's had 2 episodes in the past that seemed to resolve without intervention. Tonight the patient had pain eased off prior to eating dinner after which he returned even more severely. In the emergency department ultrasound determined gallstone lodged within the common bile duct. Lipase was found to be greater than 3000 which prompted the emergency department staff to admit the patient to the hospitalist service for gastroenterology evaluation.  Past Medical History:  Diagnosis Date  . Hypercholesteremia   . Hypercholesterolemia     Past Surgical History:  Procedure Laterality Date  . COLONOSCOPY WITH PROPOFOL N/A 08/09/2015   Procedure: COLONOSCOPY WITH PROPOFOL;  Surgeon: Hulen Luster, MD;  Location: [facility] ENDOSCOPY;  Service: Gastroenterology;  Laterality: N/A;  . ROTATOR CUFF REPAIR Left     History reviewed. No pertinent family history. No known history of CAD, DM2, or HTN Social History:  reports that he has never smoked. He has never used smokeless tobacco. His alcohol and drug histories are not on file.  Allergies: No Known Allergies  Prior to Admission medications   Medication Sig Start Date End Date Taking? Authorizing Provider  aspirin EC 81 MG tablet Take 81 mg by mouth daily.   Yes Historical Provider, MD  Multiple Vitamin (MULTIVITAMIN WITH MINERALS) TABS tablet Take 1 tablet by mouth daily.   Yes Historical Provider, MD  omeprazole (PRILOSEC) 40 MG capsule Take 40 mg by mouth daily.   Yes Historical Provider, MD   ranitidine (ZANTAC) 150 MG tablet Take 150 mg by mouth 2 (two) times daily.   Yes Historical Provider, MD  simvastatin (ZOCOR) 40 MG tablet Take 40 mg by mouth daily.   Yes Historical Provider, MD     Results for orders placed or performed during the hospital encounter of 01/22/17 (from the past 48 hour(s))  CBC with Differential     Status: Abnormal   Collection Time: 01/22/17  2:47 AM  Result Value Ref Range   WBC 10.8 (H) 3.8 - 10.6 K/uL   RBC 4.83 4.40 - 5.90 MIL/uL   Hemoglobin 14.7 13.0 - 18.0 g/dL   HCT 42.7 40.0 - 52.0 %   MCV 88.4 80.0 - 100.0 fL   MCH 30.4 26.0 - 34.0 pg   MCHC 34.4 32.0 - 36.0 g/dL   RDW 13.2 11.5 - 14.5 %   Platelets 208 150 - 440 K/uL   Neutrophils Relative % 81 %   Neutro Abs 8.8 (H) 1.4 - 6.5 K/uL   Lymphocytes Relative 10 %   Lymphs Abs 1.1 1.0 - 3.6 K/uL   Monocytes Relative 8 %   Monocytes Absolute 0.8 0.2 - 1.0 K/uL   Eosinophils Relative 1 %   Eosinophils Absolute 0.1 0 - 0.7 K/uL   Basophils Relative 0 %   Basophils Absolute 0.0 0 - 0.1 K/uL  Comprehensive metabolic panel     Status: Abnormal   Collection Time: 01/22/17  2:47 AM  Result Value Ref Range   Sodium 139 135 - 145 mmol/L   Potassium 3.8 3.5 - 5.1 mmol/L   Chloride 106  101 - 111 mmol/L   CO2 25 22 - 32 mmol/L   Glucose, Bld 149 (H) 65 - 99 mg/dL   BUN 12 6 - 20 mg/dL   Creatinine, Ser 0.91 0.61 - 1.24 mg/dL   Calcium 8.8 (L) 8.9 - 10.3 mg/dL   Total Protein 7.2 6.5 - 8.1 g/dL   Albumin 4.3 3.5 - 5.0 g/dL   AST 88 (H) 15 - 41 U/L   ALT 59 17 - 63 U/L   Alkaline Phosphatase 83 38 - 126 U/L   Total Bilirubin 1.0 0.3 - 1.2 mg/dL   GFR calc non Af Amer >60 >60 mL/min   GFR calc Af Amer >60 >60 mL/min    Comment: (NOTE) The eGFR has been calculated using the CKD EPI equation. This calculation has not been validated in all clinical situations. eGFR's persistently <60 mL/min signify possible Chronic Kidney Disease.    Anion gap 8 5 - 15  Lipase, blood     Status: Abnormal    Collection Time: 01/22/17  2:47 AM  Result Value Ref Range   Lipase 3,122 (H) 11 - 51 U/L    Comment: RESULT CONFIRMED BY MANUAL DILUTION [facility]    US Abdomen Limited Ruq  Result Date: 01/22/2017 CLINICAL DATA:  61 year old male with right upper quadrant abdominal pain and vomiting. EXAM: US ABDOMEN LIMITED - RIGHT UPPER QUADRANT COMPARISON:  Abdominal ultrasound dated 01/14/2017 FINDINGS: Gallbladder: The gallbladder is mildly distended. There multiple stones within the gallbladder. No gallbladder wall thickening or pericholecystic fluid. Positive sonographic Murphy's sign. Common bile duct: Diameter: 9 mm (previously 5 mm). A 3 mm faint echogenic focus in the central CBD may represent a stone. Liver: Diffuse fatty infiltration of the liver. IMPRESSION: 1. Cholelithiasis with positive sonographic Murphy's sign and mild dilatation of the CBD. A 3 mm central CBD stone may be present. There is however no gallbladder wall thickening or pericholecystic fluid or other definite sonographic features of acute cholecystitis. A hepatobiliary scintigraphy may provide better evaluation of the gallbladder if there is a high clinical concern for acute cholecystitis . 2. Fatty liver. Electronically Signed   By: Anner Crete M.D.   On: 01/22/2017 03:51    Review of Systems  Constitutional: Negative for chills and fever.  HENT: Negative for sore throat and tinnitus.   Eyes: Negative for blurred vision and redness.  Respiratory: Negative for cough and shortness of breath.   Cardiovascular: Negative for chest pain, palpitations, orthopnea and PND.  Gastrointestinal: Positive for abdominal pain, nausea and vomiting. Negative for diarrhea.  Genitourinary: Negative for dysuria, frequency and urgency.  Musculoskeletal: Negative for joint pain and myalgias.  Skin: Negative for rash.       No lesions  Neurological: Negative for speech change, focal weakness and weakness.  Endo/Heme/Allergies: Does not  bruise/bleed easily.       No temperature intolerance  Psychiatric/Behavioral: Negative for depression and suicidal ideas.    Blood pressure (!) 154/85, pulse (!) 55, temperature 98.6 F (37 C), temperature source Oral, resp. rate 12, height 5' 6"  (1.676 m), weight 87.5 kg (193 lb), SpO2 96 %. Physical Exam  Constitutional: He is oriented to person, place, and time. He appears well-developed and well-nourished. No distress.  HENT:  Head: Normocephalic and atraumatic.  Mouth/Throat: Oropharynx is clear and moist.  Eyes: Conjunctivae and EOM are normal. Pupils are equal, round, and reactive to light. No scleral icterus.  Neck: Normal range of motion. Neck supple. No JVD present. No tracheal deviation present.  No thyromegaly present.  Cardiovascular: Normal rate, regular rhythm and normal heart sounds.   Respiratory: Effort normal and breath sounds normal.  GI: Soft. Bowel sounds are normal. He exhibits no distension and no mass. There is tenderness. There is no rebound and no guarding.  Genitourinary:  Genitourinary Comments: Deferred  Musculoskeletal: Normal range of motion. He exhibits no edema.  Lymphadenopathy:    He has no cervical adenopathy.  Neurological: He is alert and oriented to person, place, and time. No cranial nerve deficit.  Skin: Skin is warm and dry. No rash noted. No erythema.  Psychiatric: He has a normal mood and affect. His behavior is normal. Judgment and thought content normal.     Assessment/Plan This is a 61 year old male with gallstone pancreatitis. 1. Pancreatitis: Secondary to gallstones. I have consulted gastroenterology for ERCP. I have held the patient's aspirin in anticipation of possible cholecystectomy during this hospitalization. No signs or symptoms of sepsis. 2. Hyperlipidemia: Continue statin therapy 3. DVT prophylaxis: TED hose 4. GI prophylaxis: Ranitidine The patient is a full code. Time spent on admission orders and patient care proximally 45  minutes  Harrie Foreman, MD 01/22/2017, 6:24 AM

## 2017-01-22 NOTE — H&P (Signed)
GI Inpatient Consult Note  Reason for Consult: Gallstone pancreatitis   Attending Requesting Consult: Dr. Winona Legato  History of Present Illness:  Barry Mccauslin. is a 61 y.o. male with a known history of HLD and gallstones admitted with acute gallstone pancreatitis.  Patient states he began experiencing acute-onset of severe RUQ and epigastric pain around 6pm last night.  Associated symptoms included nausea and bilious emesis.  Patient states has experienced two similar episodes over the past 6 months, particularly after eating fatty/greasy meals.  He was evaluated by Bluford Kaufmann, PA-C at [facility], on 01/14/17.  RUQ US demonstrated multiple gallstones and gallbladder wall thickness of 3mm.  He was referred to General Surgery (Dr. Renda Rolls) for consideration of elective cholecystectomy; his appointment is scheduled for 01/25/17.  In the ED, patient's vital signs remained stable. Labs demonstrated WBCs 10.8, AST 88, and lipase 3,122.  Repeat RUQ US revealed cholelithiasis with a possible 3mm central CBD stone.  Positive Murphy's sign was noted, but no features of acute cholecystitis.  An MRCP was obtained this morning, noted 10mm diameter of extrahepatic CBD, 7-61mm distal CBD, and diffuse peripancreatic edema consistent with pancreatitis.  In addition to remaining NPO, GI consultation was requested for further management.  Today, patient reports epigastric pain is mildly improved.  He remains mildly nauseous, but has not vomited in the last several hours.  Pain typically begins to worse 2 hours after receiving pain medication.  Otherwise, patient denies changes to symptomology or new GI concerns.    Patient is concerned that, if he is discharged before his gall bladder is removed, he may experience another severe episode while he is on the road.  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  He expresses concern that he could hurt someone  else while driving, or be too far from a hospital to receive care.  Past Medical History:  Past Medical History:  Diagnosis Date  . Hypercholesteremia   . Hypercholesterolemia     Problem List: Patient Active Problem List   Diagnosis Date Noted  . Gallstone pancreatitis 01/22/2017    Past Surgical History: Past Surgical History:  Procedure Laterality Date  . COLONOSCOPY WITH PROPOFOL N/A 08/09/2015   Procedure: COLONOSCOPY WITH PROPOFOL;  Surgeon: Wallace Cullens, MD;  Location: [facility] ENDOSCOPY;  Service: Gastroenterology;  Laterality: N/A;  . ROTATOR CUFF REPAIR Left     Allergies: No Known Allergies  Home Medications: Prescriptions Prior to Admission  Medication Sig Dispense Refill Last Dose  . aspirin EC 81 MG tablet Take 81 mg by mouth daily.   01/21/2017 at Unknown time  . Multiple Vitamin (MULTIVITAMIN WITH MINERALS) TABS tablet Take 1 tablet by mouth daily.   01/21/2017 at Unknown time  . omeprazole (PRILOSEC) 40 MG capsule Take 40 mg by mouth daily.   01/21/2017 at Unknown time  . ranitidine (ZANTAC) 150 MG tablet Take 150 mg by mouth 2 (two) times daily.   01/21/2017 at Unknown time  . simvastatin (ZOCOR) 40 MG tablet Take 40 mg by mouth daily.   01/21/2017 at Unknown time   Home medication reconciliation was completed with the patient.   Scheduled Inpatient Medications:   . docusate sodium  100 mg Oral BID  . famotidine  20 mg Oral BID  . multivitamin with minerals  1 tablet Oral Daily  . piperacillin-tazobactam (ZOSYN)  IV  3.375 g Intravenous Q8H  . piperacillin-tazobactam  3.375 g Intravenous Once  .  simvastatin  40 mg Oral q1800  . sodium chloride flush  3 mL Intravenous Q12H    Continuous Inpatient Infusions:   . 0.9 % NaCl with KCl 20 mEq / L 150 mL/hr at 01/22/17 1058    PRN Inpatient Medications:  acetaminophen **OR** acetaminophen, HYDROmorphone (DILAUDID) injection, ondansetron **OR** ondansetron (ZOFRAN) IV, oxyCODONE  Family History: family history is not  on file.    Social History:   reports that he has never smoked. He has never used smokeless tobacco. He reports that he drinks alcohol.   Review of Systems: Constitutional: Weight is stable.  Eyes: No changes in vision. ENT: No oral lesions, sore throat.  GI: see HPI.  Heme/Lymph: No easy bruising.  CV: No chest pain.  GU: No hematuria.  Integumentary: No rashes.  Neuro: No headaches.  Psych: No depression/anxiety.  Endocrine: No heat/cold intolerance.  Allergic/Immunologic: No urticaria.  Resp: No cough, SOB.  Musculoskeletal: No joint swelling.    Physical Examination: BP 136/68 (BP Location: Right Arm)   Pulse (!) 53   Temp 97.9 F (36.6 C) (Oral)   Resp 20   Ht 5\' 6"  (1.676 m)   Wt 87.8 kg (193 lb 9.6 oz)   SpO2 96%   BMI 31.25 kg/m  Gen: NAD, alert and oriented x 4 HEENT: PEERLA, EOMI, Neck: supple, no JVD or thyromegaly Chest: CTA bilaterally, no wheezes, crackles, or other adventitious sounds CV: RRR, no m/g/c/r Abd: soft, moderate RUQ and epigastric tenderness to light palpation, ND, +BS in all four quadrants; no HSM, guarding, ridigity, or rebound tenderness Ext: no edema, well perfused with 2+ pulses, Skin: no rash or lesions noted Lymph: no LAD  Data: Lab Results  Component Value Date   WBC 10.8 (H) 01/22/2017   HGB 14.7 01/22/2017   HCT 42.7 01/22/2017   MCV 88.4 01/22/2017   PLT 208 01/22/2017    Recent Labs Lab 01/22/17 0247  HGB 14.7   Lab Results  Component Value Date   NA 141 01/22/2017   K 3.9 01/22/2017   CL 106 01/22/2017   CO2 28 01/22/2017   BUN 12 01/22/2017   CREATININE 0.81 01/22/2017   Lab Results  Component Value Date   ALT 61 01/22/2017   AST 65 (H) 01/22/2017   ALKPHOS 79 01/22/2017   BILITOT 0.9 01/22/2017   No results for input(s): APTT, INR, PTT in the last 168 hours.   Assessment/Plan: Barry Wilson is a 61 y.o. male HLD and gallstones admitted with acute gallstone pancreatitis.  Lipase was elevated >3000  yesterday, with CBD dilation noted on US.  MRCP this morning demonstrated peripancreatic edema c/w pancreatitis and mild extrahepatic biliary duct dilation, but no choledocholithiasis.   Recommendations: 1. Remain NPO with aggressive IV hydration. Continue antiemetics and pain control. 2. Agree with General Surgery consultation for input regarding cholecystectomy. 3. Will continue to follow.  Thank you for the consult. Please call with questions or concerns.  Burman FreestoneMichelle C Johnson, PA-C [facility] Phone: [phone] Pager: [phone]

## 2017-01-22 NOTE — ED Triage Notes (Signed)
Pt in with co abd pain and vomiting since 1800 yest, scheduled to see Dr. Katrinka BlazingSmith for gallbladder removal due to gallstones.

## 2017-01-22 NOTE — Progress Notes (Signed)
Pharmacy Antibiotic Note  Barry AreaRobert L Ayo Jr. is a 61 y.o. male admitted on 01/22/2017 with intra-abdominal infection.  Pharmacy has been consulted for Zosyn dosing.  Plan: Zosyn 3.375g IV q8h (4 hour infusion).  Height: 5\' 6"  (167.6 cm) Weight: 193 lb 9.6 oz (87.8 kg) IBW/kg (Calculated) : 63.8  Temp (24hrs), Avg:98.4 F (36.9 C), Min:97.9 F (36.6 C), Max:98.6 F (37 C)   Recent Labs Lab 01/22/17 0247 01/22/17 0813  WBC 10.8*  --   CREATININE 0.91 0.81    Estimated Creatinine Clearance: 98.2 mL/min (by C-G formula based on SCr of 0.81 mg/dL).    No Known Allergies  Antimicrobials this admission: Zosyn 3/23 >>   Thank you for allowing pharmacy to be a part of this patient's care.  Clovia CuffLisa Andree Heeg, PharmD, BCPS 01/22/2017 1:39 PM

## 2017-01-22 NOTE — Progress Notes (Signed)
Patient returned from the OR.

## 2017-01-22 NOTE — Consult Note (Signed)
Patient ID: Barry Barry L Durell Jr., male   DOB: 06/29/1956, 61 y.o.   MRN: [id number]  HPI Barry Barry L Cabello Jr. is a 61 y.o. male seen in consultation at the request of Dr. Winona LegatoVaickute with an acute episode of right upper quadrant pain started last night. She reports that the pain is severe intermittent and but over the last few hours had remained constant. He also reports that his pain was radiating to his right shoulder. He developed nausea and vomiting. No evidence of fevers or chills. No biliary obstruction . He reports that he has had at least 2 attacks in the past 3 months and they're usually exacerbated by heavy meals. The workup has included a right upper quadrant ultrasound revealing cholelithiasis and there was a questionable CBD stone. The common bile duct was slightly elevated. He did underwent an epidural MRCP that I have also personally reviewed it common bile duct  was 10 mm but no evidence of  common bile duct stones or obstructive process. Pancreatitis on MRI.  His lipase was greater than 3000 . HE has good CV reserve and is notable to perform more than 4 Mets of activity without any shortness of breath or chest pain. No previous abdominal operations  HPI  Past Medical History:  Diagnosis Date  . Hypercholesteremia   . Hypercholesterolemia     Past Surgical History:  Procedure Laterality Date  . COLONOSCOPY WITH PROPOFOL N/A 08/09/2015   Procedure: COLONOSCOPY WITH PROPOFOL;  Surgeon: Wallace CullensPaul Y Oh, MD;  [facility];  Service: Gastroenterology;  Laterality: N/A;  . ROTATOR CUFF REPAIR Left     History reviewed. No pertinent family history.  Social History Social History  Substance Use Topics  . Smoking status: Never Smoker  . Smokeless tobacco: Never Used  . Alcohol use Yes     Comment: occasional    No Known Allergies  Current Facility-Administered Medications  Medication Dose Route Frequency Provider Last Rate Last Dose  . 0.9 % NaCl with KCl 20 mEq/ L   infusion   Intravenous Continuous Katharina Caperima Vaickute, MD 150 mL/hr at 01/22/17 1058    . acetaminophen (TYLENOL) tablet 650 mg  650 mg Oral Q6H PRN Arnaldo NatalMichael S Diamond, MD       Or  . acetaminophen (TYLENOL) suppository 650 mg  650 mg Rectal Q6H PRN Arnaldo NatalMichael S Diamond, MD      . docusate sodium (COLACE) capsule 100 mg  100 mg Oral BID Arnaldo NatalMichael S Diamond, MD   100 mg at 01/22/17 1059  . famotidine (PEPCID) tablet 20 mg  20 mg Oral BID Arnaldo NatalMichael S Diamond, MD   20 mg at 01/22/17 1059  . HYDROmorphone (DILAUDID) injection 1 mg  1 mg Intravenous Q3H PRN Katharina Caperima Vaickute, MD   1 mg at 01/22/17 0810  . lactated ringers bolus 1,000 mL  1,000 mL Intravenous Once Leafy Roiego F Marzell Allemand, MD      . multivitamin with minerals tablet 1 tablet  1 tablet Oral Daily Arnaldo NatalMichael S Diamond, MD      . ondansetron [facility](ZOFRAN) tablet 4 mg  4 mg Oral Q6H PRN Arnaldo NatalMichael S Diamond, MD       Or  . ondansetron [facility](ZOFRAN) injection 4 mg  4 mg Intravenous Q6H PRN Arnaldo NatalMichael S Diamond, MD      . oxyCODONE (Oxy IR/ROXICODONE) immediate release tablet 5 mg  5 mg Oral Q4H PRN Katharina Caperima Vaickute, MD      . piperacillin-tazobactam (ZOSYN) IVPB 3.375 g  3.375 g Intravenous Q8H Rima  Winona Legato, MD      . simvastatin (ZOCOR) tablet 40 mg  40 mg Oral q1800 [name], MD      . sodium chloride flush (NS) 0.9 % injection 3 mL  3 mL Intravenous Q12H [name], MD         Review of Systems A 10 point review of systems was asked and was negative except for the information on the HPI  Physical Exam Blood pressure 130/69, pulse 65, temperature 98.6 F (37 C), temperature source Oral, resp. rate 20, height 5\' 6"  (1.676 m), weight 87.8 kg (193 lb 9.6 oz), SpO2 98 %. CONSTITUTIONAL: NAD EYES: Pupils are equal, round, and reactive to light, Sclera are non-icteric. EARS, NOSE, MOUTH AND THROAT: The oropharynx is clear. The oral mucosa is pink and moist. Hearing is intact to voice. LYMPH NODES:  Lymph nodes in the neck are normal. RESPIRATORY:  Lungs are clear.  There is normal respiratory effort, with equal breath sounds bilaterally, and without pathologic use of accessory muscles. CARDIOVASCULAR: Heart is regular without murmurs, gallops, or rubs. GI: The abdomen is  soft, TTP in RUQ  And epigastric area. w + murphy. No peritonitis  GU: Rectal deferred.   MUSCULOSKELETAL: Normal muscle strength and tone. No cyanosis or edema.   SKIN: Turgor is good and there are no pathologic skin lesions or ulcers. NEUROLOGIC: Motor and sensation is grossly normal. Cranial nerves are grossly intact. PSYCH:  Oriented to person, place and time. Affect is normal.  Data Reviewed  I have personally reviewed the patient's imaging, laboratory findings and medical records.    Assessment/Plan 61 year old male with signs and symptoms consistent with both acute cholecystitis and gallstone pancreatitis. Agree with an nothing by mouth status, antibiotics for the cholecystitis and serial abdominal exams. Once symptoms resolve we will schedule him for a laparoscopic cholecystectomy. I do not see any evidence of a biliary structure that merits any further interventions. I will continue to follow him and hopefully will do a laparoscopic cholecystectomy this weekend depending on clinical findings. Scars in detail with Dr. Seth Bake and with the patient. Extensive counseling provided [name], MD FACS General Surgeon 01/22/2017, 1:39 PM

## 2017-01-23 ENCOUNTER — Inpatient Hospital Stay: Payer: BLUE CROSS/BLUE SHIELD | Admitting: Anesthesiology

## 2017-01-23 ENCOUNTER — Encounter
Admission: EM | Disposition: A | Discharge status: Home / Self Care | Payer: Self-pay | Source: Home / Self Care | Attending: Internal Medicine

## 2017-01-23 HISTORY — PX: CHOLECYSTECTOMY: SHX55

## 2017-01-23 LAB — COMPREHENSIVE METABOLIC PANEL
ALK PHOS: 48 U/L (ref 38–126)
ALT: 39 U/L (ref 17–63)
ANION GAP: 8 (ref 5–15)
AST: 33 U/L (ref 15–41)
Albumin: 3.3 g/dL — ABNORMAL LOW (ref 3.5–5.0)
BILIRUBIN TOTAL: 1.7 mg/dL — AB (ref 0.3–1.2)
BUN: 14 mg/dL (ref 6–20)
CALCIUM: 8.1 mg/dL — AB (ref 8.9–10.3)
CO2: 25 mmol/L (ref 22–32)
CREATININE: 0.8 mg/dL (ref 0.61–1.24)
Chloride: 107 mmol/L (ref 101–111)
GFR calc non Af Amer: >60 mL/min (ref >60)
Glucose, Bld: 125 mg/dL — ABNORMAL HIGH (ref 65–99)
Potassium: 3.9 mmol/L (ref 3.5–5.1)
Sodium: 140 mmol/L (ref 135–145)
TOTAL PROTEIN: 5.9 g/dL — AB (ref 6.5–8.1)

## 2017-01-23 LAB — LIPASE, BLOOD: Lipase: 387 U/L — ABNORMAL HIGH (ref 11–51)

## 2017-01-23 LAB — HIV ANTIBODY (ROUTINE TESTING W REFLEX): HIV Screen 4th Generation wRfx: NONREACTIVE

## 2017-01-23 LAB — CBC
HEMATOCRIT: 39.6 % — AB (ref 40.0–52.0)
HEMOGLOBIN: 13.3 g/dL (ref 13.0–18.0)
MCH: 30 pg (ref 26.0–34.0)
MCHC: 33.5 g/dL (ref 32.0–36.0)
MCV: 89.5 fL (ref 80.0–100.0)
Platelets: 172 10*3/uL (ref 150–440)
RBC: 4.43 MIL/uL (ref 4.40–5.90)
RDW: 13.9 % (ref 11.5–14.5)
WBC: 10.5 10*3/uL (ref 3.8–10.6)

## 2017-01-23 LAB — HEMOGLOBIN A1C
Hgb A1c MFr Bld: 5.8 % — ABNORMAL HIGH (ref 4.8–5.6)
Mean Plasma Glucose: 120 mg/dL

## 2017-01-23 LAB — SURGICAL PCR SCREEN
MRSA, PCR: NEGATIVE
Staphylococcus aureus: NEGATIVE

## 2017-01-23 SURGERY — LAPAROSCOPIC CHOLECYSTECTOMY
Anesthesia: General

## 2017-01-23 MED ORDER — SUCCINYLCHOLINE CHLORIDE 20 MG/ML IJ SOLN
INTRAMUSCULAR | Status: AC
  Filled 2017-01-23: qty 1

## 2017-01-23 MED ORDER — BUPIVACAINE-EPINEPHRINE 0.25% -1:200000 IJ SOLN
INTRAMUSCULAR | Status: DC | PRN
  Administered 2017-01-23: 30 mL

## 2017-01-23 MED ORDER — FENTANYL CITRATE (PF) 100 MCG/2ML IJ SOLN
INTRAMUSCULAR | Status: AC
  Filled 2017-01-23: qty 2

## 2017-01-23 MED ORDER — PANTOPRAZOLE SODIUM 40 MG PO TBEC
40.0000 mg | DELAYED_RELEASE_TABLET | Freq: Every day | ORAL | Status: DC
  Administered 2017-01-23 – 2017-01-25 (×3): 40 mg via ORAL
  Filled 2017-01-23 (×3): qty 1

## 2017-01-23 MED ORDER — ROCURONIUM BROMIDE 100 MG/10ML IV SOLN
INTRAVENOUS | Status: DC | PRN
  Administered 2017-01-23: 50 mg via INTRAVENOUS
  Administered 2017-01-23: 20 mg via INTRAVENOUS

## 2017-01-23 MED ORDER — SUCCINYLCHOLINE CHLORIDE 20 MG/ML IJ SOLN
INTRAMUSCULAR | Status: DC | PRN
  Administered 2017-01-23: 100 mg via INTRAVENOUS

## 2017-01-23 MED ORDER — ROCURONIUM BROMIDE 50 MG/5ML IV SOLN
INTRAVENOUS | Status: AC
  Filled 2017-01-23: qty 2

## 2017-01-23 MED ORDER — ACETAMINOPHEN 10 MG/ML IV SOLN
INTRAVENOUS | Status: AC
  Filled 2017-01-23: qty 100

## 2017-01-23 MED ORDER — ONDANSETRON HCL 4 MG/2ML IJ SOLN
INTRAMUSCULAR | Status: AC
  Filled 2017-01-23: qty 2

## 2017-01-23 MED ORDER — FENTANYL CITRATE (PF) 100 MCG/2ML IJ SOLN
INTRAMUSCULAR | Status: DC | PRN
  Administered 2017-01-23 (×2): 50 ug via INTRAVENOUS

## 2017-01-23 MED ORDER — ONDANSETRON HCL 4 MG/2ML IJ SOLN: 4.0000 mg | Freq: Once | INTRAMUSCULAR | Status: DC | PRN

## 2017-01-23 MED ORDER — LIDOCAINE HCL (CARDIAC) 20 MG/ML IV SOLN
INTRAVENOUS | Status: DC | PRN
  Administered 2017-01-23: 100 mg via INTRAVENOUS

## 2017-01-23 MED ORDER — PROPOFOL 10 MG/ML IV BOLUS
INTRAVENOUS | Status: DC | PRN
  Administered 2017-01-23: 150 mg via INTRAVENOUS

## 2017-01-23 MED ORDER — MIDAZOLAM HCL 2 MG/2ML IJ SOLN
INTRAMUSCULAR | Status: AC
  Filled 2017-01-23: qty 2

## 2017-01-23 MED ORDER — DEXAMETHASONE SODIUM PHOSPHATE 10 MG/ML IJ SOLN
INTRAMUSCULAR | Status: AC
  Filled 2017-01-23: qty 1

## 2017-01-23 MED ORDER — ONDANSETRON HCL 4 MG/2ML IJ SOLN
INTRAMUSCULAR | Status: DC | PRN
  Administered 2017-01-23: 4 mg via INTRAVENOUS

## 2017-01-23 MED ORDER — ACETAMINOPHEN 10 MG/ML IV SOLN
INTRAVENOUS | Status: DC | PRN
  Administered 2017-01-23: 1000 mg via INTRAVENOUS

## 2017-01-23 MED ORDER — FENTANYL CITRATE (PF) 100 MCG/2ML IJ SOLN: 25.0000 ug | INTRAMUSCULAR | Status: DC | PRN

## 2017-01-23 MED ORDER — LACTATED RINGERS IV SOLN
INTRAVENOUS | Status: DC | PRN
  Administered 2017-01-23: 10:00:00 via INTRAVENOUS

## 2017-01-23 MED ORDER — BUPIVACAINE-EPINEPHRINE (PF) 0.25% -1:200000 IJ SOLN
INTRAMUSCULAR | Status: AC
  Filled 2017-01-23: qty 30

## 2017-01-23 MED ORDER — SODIUM CHLORIDE 0.9 % IJ SOLN
INTRAMUSCULAR | Status: AC
  Filled 2017-01-23: qty 50

## 2017-01-23 MED ORDER — LIDOCAINE HCL (PF) 2 % IJ SOLN
INTRAMUSCULAR | Status: AC
  Filled 2017-01-23: qty 2

## 2017-01-23 MED ORDER — DEXAMETHASONE SODIUM PHOSPHATE 10 MG/ML IJ SOLN
INTRAMUSCULAR | Status: DC | PRN
  Administered 2017-01-23: 10 mg via INTRAVENOUS

## 2017-01-23 MED ORDER — SUGAMMADEX SODIUM 200 MG/2ML IV SOLN
INTRAVENOUS | Status: DC | PRN
  Administered 2017-01-23: 200 mg via INTRAVENOUS

## 2017-01-23 MED ORDER — MIDAZOLAM HCL 2 MG/2ML IJ SOLN
INTRAMUSCULAR | Status: DC | PRN
  Administered 2017-01-23: 2 mg via INTRAVENOUS

## 2017-01-23 SURGICAL SUPPLY — 46 items
APPLICATOR COTTON TIP 6IN STRL (MISCELLANEOUS) ×3 IMPLANT
APPLIER CLIP 5 13 M/L LIGAMAX5 (MISCELLANEOUS) ×3
BLADE SURG 15 STRL LF DISP TIS (BLADE) ×1 IMPLANT
BLADE SURG 15 STRL SS (BLADE) ×2
CANISTER SUCT 1200ML W/VALVE (MISCELLANEOUS) ×3 IMPLANT
CHLORAPREP W/TINT 26ML (MISCELLANEOUS) ×3 IMPLANT
CHOLANGIOGRAM CATH TAUT (CATHETERS) IMPLANT
CLEANER CAUTERY TIP 5X5 PAD (MISCELLANEOUS) ×1 IMPLANT
CLIP APPLIE 5 13 M/L LIGAMAX5 (MISCELLANEOUS) ×1 IMPLANT
DECANTER SPIKE VIAL GLASS SM (MISCELLANEOUS) ×6 IMPLANT
DEVICE TROCAR PUNCTURE CLOSURE (ENDOMECHANICALS) IMPLANT
DRAPE C-ARM XRAY 36X54 (DRAPES) ×3 IMPLANT
ELECT CAUTERY BLADE 6.4 (BLADE) ×3 IMPLANT
ELECT REM PT RETURN 9FT ADLT (ELECTROSURGICAL) ×3
ELECTRODE REM PT RTRN 9FT ADLT (ELECTROSURGICAL) ×1 IMPLANT
ENDOPOUCH RETRIEVER 10 (MISCELLANEOUS) ×3 IMPLANT
GLOVE BIO SURGEON STRL SZ7 (GLOVE) ×3 IMPLANT
GOWN STRL REUS W/ TWL LRG LVL3 (GOWN DISPOSABLE) ×3 IMPLANT
GOWN STRL REUS W/TWL LRG LVL3 (GOWN DISPOSABLE) ×6
IRRIGATION STRYKERFLOW (MISCELLANEOUS) ×1 IMPLANT
IRRIGATOR STRYKERFLOW (MISCELLANEOUS) ×3
IV CATH ANGIO 12GX3 LT BLUE (NEEDLE) ×3 IMPLANT
IV NS 1000ML (IV SOLUTION) ×2
IV NS 1000ML BAXH (IV SOLUTION) ×1 IMPLANT
L-HOOK LAP DISP 36CM (ELECTROSURGICAL) ×3
LHOOK LAP DISP 36CM (ELECTROSURGICAL) ×1 IMPLANT
LIQUID BAND (GAUZE/BANDAGES/DRESSINGS) ×3 IMPLANT
NEEDLE HYPO 22GX1.5 SAFETY (NEEDLE) ×3 IMPLANT
PACK LAP CHOLECYSTECTOMY (MISCELLANEOUS) ×3 IMPLANT
PAD CLEANER CAUTERY TIP 5X5 (MISCELLANEOUS) ×2
PENCIL ELECTRO HAND CTR (MISCELLANEOUS) ×3 IMPLANT
SCISSORS METZENBAUM CVD 33 (INSTRUMENTS) ×3 IMPLANT
SLEEVE ENDOPATH XCEL 5M (ENDOMECHANICALS) ×6 IMPLANT
SOL ANTI-FOG 6CC FOG-OUT (MISCELLANEOUS) ×1 IMPLANT
SOL FOG-OUT ANTI-FOG 6CC (MISCELLANEOUS) ×2
SPONGE LAP 18X18 5 PK (GAUZE/BANDAGES/DRESSINGS) ×3 IMPLANT
STOPCOCK 3 WAY  REPLAC (MISCELLANEOUS) IMPLANT
SUT ETHIBOND 0 MO6 C/R (SUTURE) IMPLANT
SUT MNCRL AB 4-0 PS2 18 (SUTURE) ×3 IMPLANT
SUT VIC AB 0 CT2 27 (SUTURE) IMPLANT
SUT VICRYL 0 AB UR-6 (SUTURE) ×6 IMPLANT
SYR 20CC LL (SYRINGE) ×3 IMPLANT
TROCAR XCEL BLUNT TIP 100MML (ENDOMECHANICALS) ×3 IMPLANT
TROCAR XCEL NON-BLD 5MMX100MML (ENDOMECHANICALS) ×3 IMPLANT
TUBING INSUFFLATOR HI FLOW (MISCELLANEOUS) ×3 IMPLANT
WATER STERILE IRR 1000ML POUR (IV SOLUTION) ×3 IMPLANT

## 2017-01-23 NOTE — Op Note (Signed)
Laparoscopic Cholecystectomy  Pre-operative Diagnosis: Gallstone pancreatitis  Post-operative Diagnosis: Same  Procedure: Laparoscopic cholecystectomy  Surgeon: [name], MD FACS  Anesthesia: Gen. with endotracheal tube  Findings: Cholecystitis with significant adhesions from the omentum to the gallbladder, omentum to the liver. Some mild inflammatory disease around the duodenum likely reactive from pancreatitis  Estimated Blood Loss: 25cc         Drains: none         Specimens: Gallbladder           Complications: none   Procedure Details  The patient was seen again in the Holding Room. The benefits, complications, treatment options, and expected outcomes were discussed with the patient. The risks of bleeding, infection, recurrence of symptoms, failure to resolve symptoms, bile duct damage, bile duct leak, retained common bile duct stone, bowel injury, any of which could require further surgery and/or ERCP, stent, or papillotomy were reviewed with the patient. The likelihood of improving the patient's symptoms with return to their baseline status is good.  The patient and/or family concurred with the proposed plan, giving informed consent.  The patient was taken to Operating Room, identified as Barry Wilson. and the procedure verified as Laparoscopic Cholecystectomy.  A Time Out was held and the above information confirmed.  Prior to the induction of general anesthesia, antibiotic prophylaxis was administered. VTE prophylaxis was in place. General endotracheal anesthesia was then administered and tolerated well. After the induction, the abdomen was prepped with Chloraprep and draped in the sterile fashion. The patient was positioned in the supine position.  Local anesthetic  was injected into the skin near the umbilicus and an incision made. Cut down technique was used to enter the abdominal cavity and a Hasson trochar was placed after two vicryl stitches were anchored to  the fascia. Pneumoperitoneum was then created with CO2 and tolerated well without any adverse changes in the patient's vital signs.  Three 5-mm ports were placed in the right upper quadrant all under direct vision. All skin incisions  were infiltrated with a local anesthetic agent before making the incision and placing the trocars.   The patient was positioned  in reverse Trendelenburg, tilted slightly to the patient's left.  The gallbladder was identified, the fundus grasped and retracted cephalad. Adhesions were lysed bluntly, there was significant adhesive disease from the omentum to the gallbladder and from the omentum to the abdominal wall.. The infundibulum was grasped and retracted laterally, exposing the peritoneum overlying the triangle of Calot. This was then divided and exposed in a blunt fashion. An extended critical view of the cystic duct and cystic artery was obtained.  The cystic duct was clearly identified and bluntly dissected.   Artery and duct were double clipped and divided.  The gallbladder was taken from the gallbladder fossa in a retrograde fashion with the electrocautery. The gallbladder was removed and placed in an Endocatch bag. The liver bed was irrigated and inspected. Hemostasis was achieved with the electrocautery. Copious irrigation was utilized and was repeatedly aspirated until clear.  The gallbladder and Endocatch sac were then removed through the epigastric port site.    Inspection of the right upper quadrant was performed. No bleeding, bile duct injury or leak, or bowel injury was noted. Pneumoperitoneum was released.  The periumbilical port site was closed with figure-of-eight 0 Vicryl sutures. 4-0 subcuticular Monocryl was used to close the skin. Dermabond was  applied.  The patient was then extubated and brought to the recovery room in stable condition.  Sponge, lap, and needle counts were correct at closure and at the conclusion of the case.               Sterling Bigiego  Barry Scharnhorst, MD, FACS

## 2017-01-23 NOTE — Progress Notes (Signed)
Dr. Sheryle Hailiamond notified of discontinue request. Concurred; telemetry discontinued. Windy Carinaurner,Maysin Carstens K, RN 12:39 AM 01/23/2017

## 2017-01-23 NOTE — Progress Notes (Signed)
Patient has complained of hiccups, which are causing abdominal discomfort.  Dr. Letitia LibraJohnston (on call) was notified of this and gave order for patient to have sips of water.

## 2017-01-23 NOTE — Transfer of Care (Signed)
Immediate Anesthesia Transfer of Care Note  Patient: Barry AreaRobert L Seay Jr.  Procedure(s) Performed: Procedure(s): LAPAROSCOPIC CHOLECYSTECTOMY (N/A)  Patient Location: PACU  Anesthesia Type:General  Level of Consciousness: sedated  Airway & Oxygen Therapy: Patient connected to face mask oxygen  Post-op Assessment: Post -op Vital signs reviewed and stable  Post vital signs: stable  Last Vitals:  Vitals:   01/23/17 0916 01/23/17 1120  BP: 117/67 133/68  Pulse: 81   Resp:    Temp:  36.8 C    Last Pain:  Vitals:   01/23/17 1120  TempSrc: Temporal  PainSc:          Complications: No apparent anesthesia complications

## 2017-01-23 NOTE — OR Nursing (Signed)
Spirometer given to patient

## 2017-01-23 NOTE — Progress Notes (Signed)
[facility] [place] [facility]   PATIENT NAME: Barry Wilson    MR#:  [id number]  DATE OF BIRTH:  1955-11-16  SUBJECTIVE:  CHIEF COMPLAINT:   Chief Complaint  Patient presents with  . Emesis   Pt s/p lap choley earlier currenly no pain  Review of Systems  Constitutional: Negative for chills, fever and weight loss.  HENT: Negative for congestion.   Eyes: Negative for blurred vision and double vision.  Respiratory: Negative for cough, sputum production, shortness of breath and wheezing.   Cardiovascular: Negative for chest pain, palpitations, orthopnea, leg swelling and PND.  Gastrointestinal: Negative for abdominal pain, blood in stool, constipation, diarrhea, nausea and vomiting.  Genitourinary: Negative for dysuria, frequency, hematuria and urgency.  Musculoskeletal: Negative for falls.  Neurological: Negative for dizziness, tremors, focal weakness and headaches.  Endo/Heme/Allergies: Does not bruise/bleed easily.  Psychiatric/Behavioral: Negative for depression. The patient does not have insomnia.     VITAL SIGNS: Blood pressure (!) 109/57, pulse 72, temperature 98 F (36.7 C), temperature source Axillary, resp. rate 15, height 5\' 6"  (1.676 m), weight 198 lb 11.2 oz (90.1 kg), SpO2 95 %.  PHYSICAL EXAMINATION:   GENERAL:  61 y.o.-year-old patient lying in the bed in mild to moderate distress due to epigastric abdominal discomfort.  EYES: Pupils equal, round, reactive to light and accommodation. No scleral icterus. Extraocular muscles intact.  HEENT: Head atraumatic, normocephalic. Oropharynx and nasopharynx clear.  NECK:  Supple, no jugular venous distention. No thyroid enlargement, no tenderness.  LUNGS: Normal breath sounds bilaterally, no wheezing, rales,rhonchi or crepitation. No use of accessory muscles of respiration.  CARDIOVASCULAR: S1, S2 normal. No murmurs, rubs, or gallops.  ABDOMEN: Soft, Postop EXTREMITIES: No pedal edema,  cyanosis, or clubbing.  NEUROLOGIC: Cranial nerves II through XII are intact. Muscle strength 5/5 in all extremities. Sensation intact. Gait not checked.  PSYCHIATRIC: The patient is alert and oriented x 3.  SKIN: No obvious rash, lesion, or ulcer.   ORDERS/RESULTS REVIEWED:   CBC  Recent Labs Lab 01/22/17 0247 01/23/17 0403  WBC 10.8* 10.5  HGB 14.7 13.3  HCT 42.7 39.6*  PLT 208 172  MCV 88.4 89.5  MCH 30.4 30.0  MCHC 34.4 33.5  RDW 13.2 13.9  LYMPHSABS 1.1  --   MONOABS 0.8  --   EOSABS 0.1  --   BASOSABS 0.0  --    ------------------------------------------------------------------------------------------------------------------  Chemistries   Recent Labs Lab 01/22/17 0247 01/22/17 0813 01/23/17 0403  NA 139 141 140  K 3.8 3.9 3.9  CL 106 106 107  CO2 25 28 25   GLUCOSE 149* 138* 125*  BUN 12 12 14   CREATININE 0.91 0.81 0.80  CALCIUM 8.8* 8.6* 8.1*  AST 88* 65* 33  ALT 59 61 39  ALKPHOS 83 79 48  BILITOT 1.0 0.9 1.7*   ------------------------------------------------------------------------------------------------------------------ estimated creatinine clearance is 100.6 mL/min (by C-G formula based on SCr of 0.8 mg/dL). ------------------------------------------------------------------------------------------------------------------  Recent Labs  01/22/17 0813  TSH 1.550    Cardiac Enzymes No results for input(s): CKMB, TROPONINI, MYOGLOBIN in the last 168 hours.  Invalid input(s): CK ------------------------------------------------------------------------------------------------------------------ Invalid input(s): POCBNP ---------------------------------------------------------------------------------------------------------------  RADIOLOGY: Mr 3d Recon At Scanner  Result Date: 01/22/2017 CLINICAL DATA:  Cholelithiasis with right upper quadrant pain and potential common bile duct stones seen on ultrasound. EXAM: MRI ABDOMEN WITHOUT AND WITH  CONTRAST (INCLUDING MRCP) TECHNIQUE: Multiplanar multisequence MR imaging of the abdomen was performed both before and after the administration of intravenous contrast. Heavily  T2-weighted images of the biliary and pancreatic ducts were obtained, and three-dimensional MRCP images were rendered by post processing. CONTRAST:  15mL MULTIHANCE GADOBENATE DIMEGLUMINE 529 MG/ML IV SOLN COMPARISON:  Ultrasound exam from earlier today. FINDINGS: Lower chest:  Dependent atelectasis. Hepatobiliary: No focal abnormality in the liver parenchyma. Geographic fatty deposition noted within the liver parenchyma. Gallbladder is distended with tiny gallstones evident. Trace prominence intrahepatic bile ducts is associated with 10 mm diameter extrahepatic common duct. Common bile duct in the head of the pancreas measures 7-8 mm. No filling defect in the common duct or common bile duct to suggest choledocholithiasis. Pancreas: Pancreatic parenchyma shows diffuse edema in there is peripancreatic edema/inflammation. Main pancreatic duct is mildly distended. Pancreas enhances throughout. No focal mass lesion within the pancreatic parenchyma. Spleen: No splenomegaly. No focal mass lesion. Adrenals/Urinary Tract: No adrenal nodule or mass. Kidneys are unremarkable. Stomach/Bowel: Stomach is nondistended. No gastric wall thickening. No evidence of outlet obstruction. Duodenum is normally positioned as is the ligament of Treitz. Visualize small bowel loops and colonic segments of the abdomen are unremarkable. Vascular/Lymphatic: No evidence for abdominal aortic aneurysm. Other: Edema is identified in the anterior pararenal space. Musculoskeletal: No abnormal marrow enhancement within the visualized bony anatomy. IMPRESSION: 1. Edema within and around the pancreatic parenchyma, consistent with pancreatitis. Pancreatic parenchyma enhances throughout. 2. Cholelithiasis without choledocholithiasis. 3. Mild extrahepatic biliary duct dilatation.  Electronically Signed   By: Kennith CenterEric  Mansell M.D.   On: 01/22/2017 10:14   Mr Abdomen Mrcp Vivien RossettiW Wo Contast  Result Date: 01/22/2017 CLINICAL DATA:  Cholelithiasis with right upper quadrant pain and potential common bile duct stones seen on ultrasound. EXAM: MRI ABDOMEN WITHOUT AND WITH CONTRAST (INCLUDING MRCP) TECHNIQUE: Multiplanar multisequence MR imaging of the abdomen was performed both before and after the administration of intravenous contrast. Heavily T2-weighted images of the biliary and pancreatic ducts were obtained, and three-dimensional MRCP images were rendered by post processing. CONTRAST:  15mL MULTIHANCE GADOBENATE DIMEGLUMINE 529 MG/ML IV SOLN COMPARISON:  Ultrasound exam from earlier today. FINDINGS: Lower chest:  Dependent atelectasis. Hepatobiliary: No focal abnormality in the liver parenchyma. Geographic fatty deposition noted within the liver parenchyma. Gallbladder is distended with tiny gallstones evident. Trace prominence intrahepatic bile ducts is associated with 10 mm diameter extrahepatic common duct. Common bile duct in the head of the pancreas measures 7-8 mm. No filling defect in the common duct or common bile duct to suggest choledocholithiasis. Pancreas: Pancreatic parenchyma shows diffuse edema in there is peripancreatic edema/inflammation. Main pancreatic duct is mildly distended. Pancreas enhances throughout. No focal mass lesion within the pancreatic parenchyma. Spleen: No splenomegaly. No focal mass lesion. Adrenals/Urinary Tract: No adrenal nodule or mass. Kidneys are unremarkable. Stomach/Bowel: Stomach is nondistended. No gastric wall thickening. No evidence of outlet obstruction. Duodenum is normally positioned as is the ligament of Treitz. Visualize small bowel loops and colonic segments of the abdomen are unremarkable. Vascular/Lymphatic: No evidence for abdominal aortic aneurysm. Other: Edema is identified in the anterior pararenal space. Musculoskeletal: No abnormal  marrow enhancement within the visualized bony anatomy. IMPRESSION: 1. Edema within and around the pancreatic parenchyma, consistent with pancreatitis. Pancreatic parenchyma enhances throughout. 2. Cholelithiasis without choledocholithiasis. 3. Mild extrahepatic biliary duct dilatation. Electronically Signed   By: Kennith CenterEric  Mansell M.D.   On: 01/22/2017 10:14   [place] Abdomen Limited Ruq  Result Date: 01/22/2017 CLINICAL DATA:  61 year old male with right upper quadrant abdominal pain and vomiting. EXAM: US ABDOMEN LIMITED - RIGHT UPPER QUADRANT COMPARISON:  Abdominal ultrasound dated 01/14/2017  FINDINGS: Gallbladder: The gallbladder is mildly distended. There multiple stones within the gallbladder. No gallbladder wall thickening or pericholecystic fluid. Positive sonographic Murphy's sign. Common bile duct: Diameter: 9 mm (previously 5 mm). A 3 mm faint echogenic focus in the central CBD may represent a stone. Liver: Diffuse fatty infiltration of the liver. IMPRESSION: 1. Cholelithiasis with positive sonographic Murphy's sign and mild dilatation of the CBD. A 3 mm central CBD stone may be present. There is however no gallbladder wall thickening or pericholecystic fluid or other definite sonographic features of acute cholecystitis. A hepatobiliary scintigraphy may provide better evaluation of the gallbladder if there is a high clinical concern for acute cholecystitis . 2. Fatty liver. Electronically Signed   By: Elgie Collard M.D.   On: 01/22/2017 03:51    EKG: No orders found for this or any previous visit.  ASSESSMENT AND PLAN:  Active Problems:   Gallstone pancreatitis   Acute biliary pancreatitis  #1. Acute gallstone pancreatitis, MRCP revealed passed stone, continue IV fluids and hydrate, following lipase level in the morning, pain medications, nothing by mouth, appreciate the gastroenterologist input #2. Acute cholecystitis with numerous gallstones, continue Zosyn, s status post lap  cholecystectomy #3. Elevated transaminases due to cholecystitis, follow closely, repeat in the a.m.  #4. Leukocytosis, follow with antibiotic therapy Management plans discussed with the patient, family and they are in agreement.   DRUG ALLERGIES: No Known Allergies  CODE STATUS:     Code Status Orders        Start     Ordered   01/22/17 0728  Full code  Continuous     01/22/17 0727    Code Status History    Date Active Date Inactive Code Status Order ID Comments User Context   This patient has a current code status but no historical code status.      TOTAL TIME TAKING CARE OF THIS PATIENT: 35 minutes.  Discussed with patient, his wife, Dr. Cherie Dark, [facility] M.D on 01/23/2017 at 12:53 PM  Between 7am to 6pm - Pager - [phone]  After 6pm go to [email or web address] - password [email or web address] [facility]  [facility]  Office  [phone]  CC: Primary care physician; Rafael Bihari, MD

## 2017-01-23 NOTE — Progress Notes (Signed)
[facility] to discontinue [facility]metry; Patient in SR continuously; On-call Hospitalist paged, awaiting callback. Windy Carinaurner,Easter Schinke K, RN 12:35 AM 01/23/2017

## 2017-01-23 NOTE — Anesthesia Postprocedure Evaluation (Signed)
Anesthesia Post Note  Patient: Barry AreaRobert L Kye Jr.  Procedure(s) Performed: Procedure(s) (LRB): LAPAROSCOPIC CHOLECYSTECTOMY (N/A)  Patient location during evaluation: PACU Anesthesia Type: General Level of consciousness: sedated Pain management: pain level controlled Vital Signs Assessment: post-procedure vital signs reviewed and stable Respiratory status: spontaneous breathing and respiratory function stable Cardiovascular status: blood pressure returned to baseline and stable Anesthetic complications: no     Last Vitals:  Vitals:   01/23/17 0916 01/23/17 1120  BP: 117/67 133/68  Pulse: 81   Resp:    Temp:  36.8 C    Last Pain:  Vitals:   01/23/17 1120  TempSrc: Temporal  PainSc: Asleep                 Tamyka Bezio K

## 2017-01-23 NOTE — Anesthesia Post-op Follow-up Note (Cosign Needed)
Anesthesia QCDR form completed.        

## 2017-01-23 NOTE — Progress Notes (Signed)
Preoperative Review   Patient is met in preoperatively .  Pancreatis clinically resolving with significant improvement of lipase. I do recommend lap chole.The history is reviewed in the chart and with the patient. I personally reviewed the options and rationale as well as the risks of this procedure that have been previously discussed with the patient. All questions asked by the patient and/or family were answered to their satisfaction.  Patient agrees to proceed with this procedure at this time.  Monque Haggar M.D. FACS   

## 2017-01-23 NOTE — Anesthesia Procedure Notes (Signed)
Procedure Name: Intubation Date/Time: 01/23/2017 9:44 AM Performed by: Irving BurtonBACHICH, Tuyet Bader Pre-anesthesia Checklist: Patient identified, Emergency Drugs available, Suction available and Patient being monitored Patient Re-evaluated:Patient Re-evaluated prior to inductionOxygen Delivery Method: Circle system utilized Preoxygenation: Pre-oxygenation with 100% oxygen Intubation Type: IV induction Ventilation: Oral airway inserted - appropriate to patient size Laryngoscope Size: McGraph and 4 Grade View: Grade I Tube type: Oral Tube size: 7.5 mm Number of attempts: 2 Airway Equipment and Method: Stylet and Video-laryngoscopy Placement Confirmation: ETT inserted through vocal cords under direct vision,  positive ETCO2 and breath sounds checked- equal and bilateral Secured at: 21 cm Tube secured with: Tape Dental Injury: Teeth and Oropharynx as per pre-operative assessment  Difficulty Due To: Difficult Airway- due to anterior larynx, Difficult Airway- due to limited oral opening and Difficult Airway- due to dentition

## 2017-01-23 NOTE — Anesthesia Preprocedure Evaluation (Signed)
Anesthesia Evaluation  Patient identified by MRN, date of birth, ID band Patient awake    Reviewed: Allergy & Precautions, NPO status , Patient's Chart, lab work & pertinent test results  History of Anesthesia Complications Negative for: history of anesthetic complications  Airway Mallampati: II       Dental  (+) Edentulous Upper, Missing, Poor Dentition, Chipped   Pulmonary neg pulmonary ROS,           Cardiovascular negative cardio ROS       Neuro/Psych negative neurological ROS     GI/Hepatic Neg liver ROS, GERD  Medicated,  Endo/Other  negative endocrine ROS  Renal/GU negative Renal ROS     Musculoskeletal   Abdominal   Peds  Hematology negative hematology ROS (+)   Anesthesia Other Findings   Reproductive/Obstetrics                             Anesthesia Physical Anesthesia Plan  ASA: II and emergent  Anesthesia Plan: General   Post-op Pain Management:    Induction: Intravenous  Airway Management Planned: Oral ETT  Additional Equipment:   Intra-op Plan:   Post-operative Plan:   Informed Consent: I have reviewed the patients History and Physical, chart, labs and discussed the procedure including the risks, benefits and alternatives for the proposed anesthesia with the patient or authorized representative who has indicated his/her understanding and acceptance.     Plan Discussed with:   Anesthesia Plan Comments:         Anesthesia Quick Evaluation

## 2017-01-24 ENCOUNTER — Encounter: Payer: Self-pay | Admitting: Surgery

## 2017-01-24 LAB — COMPREHENSIVE METABOLIC PANEL
ALT: 78 U/L — ABNORMAL HIGH (ref 17–63)
ANION GAP: 4 — AB (ref 5–15)
AST: 81 U/L — AB (ref 15–41)
Albumin: 3.1 g/dL — ABNORMAL LOW (ref 3.5–5.0)
Alkaline Phosphatase: 49 U/L (ref 38–126)
BUN: 17 mg/dL (ref 6–20)
CALCIUM: 8.2 mg/dL — AB (ref 8.9–10.3)
CHLORIDE: 109 mmol/L (ref 101–111)
CO2: 25 mmol/L (ref 22–32)
Creatinine, Ser: 0.94 mg/dL (ref 0.61–1.24)
GFR calc Af Amer: >60 mL/min (ref >60)
GFR calc non Af Amer: >60 mL/min (ref >60)
Glucose, Bld: 141 mg/dL — ABNORMAL HIGH (ref 65–99)
Potassium: 4.4 mmol/L (ref 3.5–5.1)
SODIUM: 138 mmol/L (ref 135–145)
Total Bilirubin: 1.3 mg/dL — ABNORMAL HIGH (ref 0.3–1.2)
Total Protein: 5.9 g/dL — ABNORMAL LOW (ref 6.5–8.1)

## 2017-01-24 LAB — LIPASE, BLOOD: Lipase: 72 U/L — ABNORMAL HIGH (ref 11–51)

## 2017-01-24 LAB — CBC
HCT: 33.3 % — ABNORMAL LOW (ref 40.0–52.0)
Hemoglobin: 11.4 g/dL — ABNORMAL LOW (ref 13.0–18.0)
MCH: 30.8 pg (ref 26.0–34.0)
MCHC: 34.2 g/dL (ref 32.0–36.0)
MCV: 90.1 fL (ref 80.0–100.0)
PLATELETS: 140 10*3/uL — AB (ref 150–440)
RBC: 3.69 MIL/uL — AB (ref 4.40–5.90)
RDW: 13.8 % (ref 11.5–14.5)
WBC: 8.6 10*3/uL (ref 3.8–10.6)

## 2017-01-24 LAB — BILIRUBIN, FRACTIONATED(TOT/DIR/INDIR)
BILIRUBIN DIRECT: 0.3 mg/dL (ref 0.1–0.5)
BILIRUBIN INDIRECT: 1 mg/dL — AB (ref 0.3–0.9)
BILIRUBIN TOTAL: 1.3 mg/dL — AB (ref 0.3–1.2)

## 2017-01-24 MED ORDER — AMOXICILLIN-POT CLAVULANATE 875-125 MG PO TABS
1.0000 | ORAL_TABLET | Freq: Two times a day (BID) | ORAL | Status: DC
  Administered 2017-01-24 – 2017-01-25 (×2): 1 via ORAL
  Filled 2017-01-24 (×3): qty 1

## 2017-01-24 MED ORDER — ALUM & MAG HYDROXIDE-SIMETH 200-200-20 MG/5ML PO SUSP
30.0000 mL | Freq: Four times a day (QID) | ORAL | Status: DC | PRN
  Administered 2017-01-24: 30 mL via ORAL
  Filled 2017-01-24: qty 30

## 2017-01-24 MED ORDER — KETOROLAC TROMETHAMINE 15 MG/ML IJ SOLN
15.0000 mg | Freq: Four times a day (QID) | INTRAMUSCULAR | Status: DC
  Administered 2017-01-24 – 2017-01-25 (×5): 15 mg via INTRAVENOUS
  Filled 2017-01-24 (×5): qty 1

## 2017-01-24 NOTE — Progress Notes (Signed)
POD # 1 Doing well AVSS Hiccups resolved after drinking Ginger ale TB nml Lipase normalizing Hungry  PE NAD Abd: soft, incisions c/d/I, appropriate incisional tenderness  A/P Doing well Advance diet Likely DC in am

## 2017-01-24 NOTE — Progress Notes (Signed)
[facility] [place] [facility]   PATIENT NAME: Barry Wilson    MR#:  [id number]  DATE OF BIRTH:  04/12/1956  SUBJECTIVE:  CHIEF COMPLAINT:   Chief Complaint  Patient presents with  . Emesis   Patient states that he is having hiccups and has some abdominal discomfort related to surgery  Review of Systems  Constitutional: Negative for chills, fever and weight loss.  HENT: Negative for congestion.   Eyes: Negative for blurred vision and double vision.  Respiratory: Negative for cough, sputum production, shortness of breath and wheezing.   Cardiovascular: Negative for chest pain, palpitations, orthopnea, leg swelling and PND.  Gastrointestinal: Positive for abdominal pain. Negative for blood in stool, constipation, diarrhea, nausea and vomiting.  Genitourinary: Negative for dysuria, frequency, hematuria and urgency.  Musculoskeletal: Negative for falls.  Neurological: Negative for dizziness, tremors, focal weakness and headaches.  Endo/Heme/Allergies: Does not bruise/bleed easily.  Psychiatric/Behavioral: Negative for depression. The patient does not have insomnia.     VITAL SIGNS: Blood pressure 121/64, pulse 88, temperature 98.3 F (36.8 C), temperature source Oral, resp. rate 18, height 5\' 6"  (1.676 m), weight 198 lb 11.2 oz (90.1 kg), SpO2 92 %.  PHYSICAL EXAMINATION:   GENERAL:  61 y.o.-year-old patient lying in the bed in mild to moderate distress due to epigastric abdominal discomfort.  EYES: Pupils equal, round, reactive to light and accommodation. No scleral icterus. Extraocular muscles intact.  HEENT: Head atraumatic, normocephalic. Oropharynx and nasopharynx clear.  NECK:  Supple, no jugular venous distention. No thyroid enlargement, no tenderness.  LUNGS: Normal breath sounds bilaterally, no wheezing, rales,rhonchi or crepitation. No use of accessory muscles of respiration.  CARDIOVASCULAR: S1, S2 normal. No murmurs, rubs, or  gallops.  ABDOMEN: Soft, Postop diminished bowel sounds nontender nondistended EXTREMITIES: No pedal edema, cyanosis, or clubbing.  NEUROLOGIC: Cranial nerves II through XII are intact. Muscle strength 5/5 in all extremities. Sensation intact. Gait not checked.  PSYCHIATRIC: The patient is alert and oriented x 3.  SKIN: No obvious rash, lesion, or ulcer.   ORDERS/RESULTS REVIEWED:   CBC  Recent Labs Lab 01/22/17 0247 01/23/17 0403 01/24/17 0429  WBC 10.8* 10.5 8.6  HGB 14.7 13.3 11.4*  HCT 42.7 39.6* 33.3*  PLT 208 172 140*  MCV 88.4 89.5 90.1  MCH 30.4 30.0 30.8  MCHC 34.4 33.5 34.2  RDW 13.2 13.9 13.8  LYMPHSABS 1.1  --   --   MONOABS 0.8  --   --   EOSABS 0.1  --   --   BASOSABS 0.0  --   --    ------------------------------------------------------------------------------------------------------------------  Chemistries   Recent Labs Lab 01/22/17 0247 01/22/17 0813 01/23/17 0403 01/24/17 0429 01/24/17 0630  NA 139 141 140 138  --   K 3.8 3.9 3.9 4.4  --   CL 106 106 107 109  --   CO2 25 28 25 25   --   GLUCOSE 149* 138* 125* 141*  --   BUN 12 12 14 17   --   CREATININE 0.91 0.81 0.80 0.94  --   CALCIUM 8.8* 8.6* 8.1* 8.2*  --   AST 88* 65* 33 81*  --   ALT 59 61 39 78*  --   ALKPHOS 83 79 48 49  --   BILITOT 1.0 0.9 1.7* 1.3* 1.3*   ------------------------------------------------------------------------------------------------------------------ estimated creatinine clearance is 85.6 mL/min (by C-G formula based on SCr of 0.94 mg/dL). ------------------------------------------------------------------------------------------------------------------  Recent Labs  01/22/17 0813  TSH 1.550  Cardiac Enzymes No results for input(s): CKMB, TROPONINI, MYOGLOBIN in the last 168 hours.  Invalid input(s): CK ------------------------------------------------------------------------------------------------------------------ Invalid input(s):  POCBNP ---------------------------------------------------------------------------------------------------------------  RADIOLOGY: No results found.  EKG: No orders found for this or any previous visit.  ASSESSMENT AND PLAN:  Active Problems:   Gallstone pancreatitis   Acute biliary pancreatitis  #1. Acute gallstone pancreatitis, MRCP revealed passed stone, status post lap cholecystectomy, liver function test all trending down I have discussed with surgery will go ahead and advance his diet to clear liquids #2. Acute cholecystitis with numerous gallstones, continue Zosyn, s status post lap cholecystectomy, stop IV antibiotics start Augmentin #3. Elevated transaminases due to cholecystitis, trending down #4. Leukocytosis, follow with antibiotic therapy Management plans discussed with the patient, family and they are in agreement.   DRUG ALLERGIES: No Known Allergies  CODE STATUS:     Code Status Orders        Start     Ordered   01/22/17 0728  Full code  Continuous     01/22/17 0727    Code Status History    Date Active Date Inactive Code Status Order ID Comments User Context   This patient has a current code status but no historical code status.      TOTAL TIME TAKING CARE OF THIS PATIENT: 30 minutes.  Discussed with patient, his wife, Dr. Cherie Dark, [facility] M.D on 01/24/2017 at 2:10 PM  Between 7am to 6pm - Pager - [phone]  After 6pm go to [email or web address] - password [email or web address] [facility]  [facility]  Office  [phone]  CC: Primary care physician; Rafael Bihari, MD

## 2017-01-24 NOTE — Progress Notes (Signed)
Patient has been ambulating in the room and in the hallway.  Clear liquid diet has been tolerated. Patient did complain of heartburn once.  Dr. Allena KatzPatel was notified and an order for maalox was entered.  Maalox was administered once and relief was provided.  Patient has required dilaudid for pain once and also oxycodone for pain once.  Incisions are present to abdomen, with dermabond. No distress noted.

## 2017-01-25 MED ORDER — DOCUSATE SODIUM 100 MG PO CAPS: 100.0000 mg | ORAL_CAPSULE | Freq: Two times a day (BID) | ORAL | 0 refills | Status: DC

## 2017-01-25 MED ORDER — ONDANSETRON HCL 4 MG PO TABS: 4.0000 mg | ORAL_TABLET | Freq: Three times a day (TID) | ORAL | 0 refills | Status: DC | PRN

## 2017-01-25 MED ORDER — SIMETHICONE 80 MG PO CHEW: 80.0000 mg | CHEWABLE_TABLET | Freq: Four times a day (QID) | ORAL | 0 refills | Status: DC | PRN

## 2017-01-25 MED ORDER — ALUM & MAG HYDROXIDE-SIMETH 200-200-20 MG/5ML PO SUSP: 30.0000 mL | Freq: Four times a day (QID) | ORAL | 0 refills | Status: DC | PRN

## 2017-01-25 MED ORDER — OXYCODONE HCL 5 MG PO TABS: 5.0000 mg | ORAL_TABLET | ORAL | 0 refills | Status: DC | PRN

## 2017-01-25 NOTE — Progress Notes (Signed)
01/25/2017  12:09 PM  Barry Areaobert L Yuille Jr. to be D/C'd Home per MD order.  Discussed prescriptions and follow up appointments with the patient. Prescriptions given to patient, medication list explained in detail. Pt verbalized understanding.  Allergies as of 01/25/2017   No Known Allergies     Medication List    STOP taking these medications   ranitidine 150 MG tablet Commonly known as:  ZANTAC     TAKE these medications   alum & mag hydroxide-simeth 200-200-20 MG/5ML suspension Commonly known as:  MAALOX/MYLANTA Take 30 mLs by mouth every 6 (six) hours as needed for indigestion or heartburn. Notes to patient:  Last administered 01/24/17   aspirin EC 81 MG tablet Take 81 mg by mouth daily.   docusate sodium 100 MG capsule Commonly known as:  COLACE Take 1 capsule (100 mg total) by mouth 2 (two) times daily. Notes to patient:  Last administered 01/25/17 morning   multivitamin with minerals Tabs tablet Take 1 tablet by mouth daily. Notes to patient:  Last administered 01/25/17 morning   omeprazole 40 MG capsule Commonly known as:  PRILOSEC Take 40 mg by mouth daily. Notes to patient:  Received similar med pantoprazole 01/25/17 morning   ondansetron 4 MG tablet Commonly known as:  ZOFRAN Take 1 tablet (4 mg total) by mouth every 8 (eight) hours as needed for nausea or vomiting.   oxyCODONE 5 MG immediate release tablet Commonly known as:  Oxy IR/ROXICODONE Take 1 tablet (5 mg total) by mouth every 4 (four) hours as needed for moderate pain.   simethicone 80 MG chewable tablet Commonly known as:  MYLICON Chew 1 tablet (80 mg total) by mouth 4 (four) times daily as needed for flatulence.   simvastatin 40 MG tablet Commonly known as:  ZOCOR Take 40 mg by mouth daily. Notes to patient:  Last administered 01/24/17 evening       Vitals:   01/24/17 1952 01/25/17 0545  BP: 119/67 126/63  Pulse: 68 64  Resp: 18 18  Temp: 98.5 F (36.9 C) 98.2 F (36.8 C)    Skin  clean, dry and intact without evidence of skin break down, no evidence of skin tears noted. IV catheter discontinued intact. Site without signs and symptoms of complications. Dressing and pressure applied. Pt denies pain at this time. No complaints noted.  An After Visit Summary was printed and given to the patient. Patient escorted via WC, and D/C home via private auto.  Bradly Chrisougherty, Barry Wilson

## 2017-01-25 NOTE — Plan of Care (Signed)
Problem: Activity: Goal: Ability to tolerate increased activity will improve Outcome: Adequate for Discharge Ambulating full length of hall around nursing station; no acute distress.

## 2017-01-25 NOTE — Progress Notes (Signed)
2 Days Post-Op  Subjective: Patient feels well tolerating regular diet is dressed and ready to go home. Objective: Vital signs in last 24 hours: Temp:  [98.2 F (36.8 C)-98.5 F (36.9 C)] 98.2 F (36.8 C) (03/26 0545) Pulse Rate:  [64-68] 64 (03/26 0545) Resp:  [18] 18 (03/26 0545) BP: (119-126)/(63-67) 126/63 (03/26 0545) SpO2:  [95 %] 95 % (03/26 0545) Weight:  [198 lb 3.2 oz (89.9 kg)] 198 lb 3.2 oz (89.9 kg) (03/26 0500) Last BM Date: 01/21/17  Intake/Output from previous day: 03/25 0701 - 03/26 0700 In: 723 [P.O.:720; I.V.:3] Out: 550 [Urine:550] Intake/Output this shift: No intake/output data recorded.  Physical exam:  No icterus no jaundice abdomen soft nontender wounds are clean with Dermabond. Calves are nontender  Lab Results: CBC   Recent Labs  01/23/17 0403 01/24/17 0429  WBC 10.5 8.6  HGB 13.3 11.4*  HCT 39.6* 33.3*  PLT 172 140*   BMET  Recent Labs  01/23/17 0403 01/24/17 0429  NA 140 138  K 3.9 4.4  CL 107 109  CO2 25 25  GLUCOSE 125* 141*  BUN 14 17  CREATININE 0.80 0.94  CALCIUM 8.1* 8.2*   PT/INR No results for input(s): LABPROT, INR in the last 72 hours. ABG No results for input(s): PHART, HCO3 in the last 72 hours.  Invalid input(s): PCO2, PO2  Studies/Results: No results found.  Anti-infectives: Anti-infectives    Start     Dose/Rate Route Frequency Ordered Stop   01/24/17 1100  amoxicillin-clavulanate (AUGMENTIN) 875-125 MG per tablet 1 tablet     1 tablet Oral Every 12 hours 01/24/17 1059     01/22/17 1400  piperacillin-tazobactam (ZOSYN) IVPB 3.375 g  Status:  Discontinued     3.375 g 12.5 mL/hr over 240 Minutes Intravenous Every 8 hours 01/22/17 0941 01/24/17 1059   01/22/17 0945  piperacillin-tazobactam (ZOSYN) IVPB 3.375 g     3.375 g 100 mL/hr over 30 Minutes Intravenous  Once 01/22/17 [id number] 01/22/17 1129      Assessment/Plan: s/p Procedure(s): LAPAROSCOPIC CHOLECYSTECTOMY   Patient doing very well discussed  with prime doc. Patient will be discharged today to follow-up next week. 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  Lattie Hawichard E Xandra Laramee, MD, FACS  01/25/2017

## 2017-01-25 NOTE — Discharge Summary (Signed)
[facility] [name] [facility]  Barry L Motton Jr., 61 y.o., DOB 08/20/1956, MRN [id number]. Admission date: 01/22/2017 Discharge Date 01/25/2017 Primary MD Barry BihariWALKER III, [name], MD Admitting Physician Barry NatalMichael S Diamond, MD  Admission Diagnosis  Vomiting [R11.10] RUQ pain [R10.11] Calculus of gallbladder and bile duct without cholecystitis or obstruction [K80.70] Acute biliary pancreatitis, unspecified complication status [K85.10]  Discharge Diagnosis   Active Problems:  Gallstone pancreatitis  Acute biliary pancreatitis  elevated transaminase due to gallstone pancreatitis  leukocytosis         Hospital Course   Patient is a 61 year old white male presented to the hospital with nausea vomiting and right upper quadrant abdominal pain. He was further evaluated and noted to have acute gallstone pancreatitis.right upper quadrant ultrasound revealed a large, bile duct with possible common bile duct stone. MRCP was performed which revealed no stones so he likely passed a stone. Patient was seen by surgeons who recommended lab cholecystectomy. Patient underwent lap cholecystectomy was noted to have multiple adhesions. He tolerated the procedure without any complications is doing much better and stable for discharge.           Consults  GI  Significant Tests:  See full reports for all details     Mr 3d Recon At Scanner  Result Date: 01/22/2017 CLINICAL DATA:  Cholelithiasis with right upper quadrant pain and potential common bile duct stones seen on ultrasound. EXAM: MRI ABDOMEN WITHOUT AND WITH CONTRAST (INCLUDING MRCP) TECHNIQUE: Multiplanar multisequence MR imaging of the abdomen was performed both before and after the administration of intravenous contrast. Heavily T2-weighted images of the biliary and pancreatic ducts were obtained, and three-dimensional MRCP images were rendered by post processing. CONTRAST:  15mL MULTIHANCE GADOBENATE DIMEGLUMINE 529 MG/ML  IV SOLN COMPARISON:  Ultrasound exam from earlier today. FINDINGS: Lower chest:  Dependent atelectasis. Hepatobiliary: No focal abnormality in the liver parenchyma. Geographic fatty deposition noted within the liver parenchyma. Gallbladder is distended with tiny gallstones evident. Trace prominence intrahepatic bile ducts is associated with 10 mm diameter extrahepatic common duct. Common bile duct in the head of the pancreas measures 7-8 mm. No filling defect in the common duct or common bile duct to suggest choledocholithiasis. Pancreas: Pancreatic parenchyma shows diffuse edema in there is peripancreatic edema/inflammation. Main pancreatic duct is mildly distended. Pancreas enhances throughout. No focal mass lesion within the pancreatic parenchyma. Spleen: No splenomegaly. No focal mass lesion. Adrenals/Urinary Tract: No adrenal nodule or mass. Kidneys are unremarkable. Stomach/Bowel: Stomach is nondistended. No gastric wall thickening. No evidence of outlet obstruction. Duodenum is normally positioned as is the ligament of Treitz. Visualize small bowel loops and colonic segments of the abdomen are unremarkable. Vascular/Lymphatic: No evidence for abdominal aortic aneurysm. Other: Edema is identified in the anterior pararenal space. Musculoskeletal: No abnormal marrow enhancement within the visualized bony anatomy. IMPRESSION: 1. Edema within and around the pancreatic parenchyma, consistent with pancreatitis. Pancreatic parenchyma enhances throughout. 2. Cholelithiasis without choledocholithiasis. 3. Mild extrahepatic biliary duct dilatation. Electronically Signed   By: Kennith CenterEric  Mansell M.D.   On: 01/22/2017 10:14   Mr Abdomen Mrcp Vivien RossettiW Wo Contast  Result Date: 01/22/2017 CLINICAL DATA:  Cholelithiasis with right upper quadrant pain and potential common bile duct stones seen on ultrasound. EXAM: MRI ABDOMEN WITHOUT AND WITH CONTRAST (INCLUDING MRCP) TECHNIQUE: Multiplanar multisequence MR imaging of the abdomen was  performed both before and after the administration of intravenous contrast. Heavily T2-weighted images of the biliary and pancreatic ducts were obtained, and three-dimensional MRCP images  were rendered by post processing. CONTRAST:  15mL MULTIHANCE GADOBENATE DIMEGLUMINE 529 MG/ML IV SOLN COMPARISON:  Ultrasound exam from earlier today. FINDINGS: Lower chest:  Dependent atelectasis. Hepatobiliary: No focal abnormality in the liver parenchyma. Geographic fatty deposition noted within the liver parenchyma. Gallbladder is distended with tiny gallstones evident. Trace prominence intrahepatic bile ducts is associated with 10 mm diameter extrahepatic common duct. Common bile duct in the head of the pancreas measures 7-8 mm. No filling defect in the common duct or common bile duct to suggest choledocholithiasis. Pancreas: Pancreatic parenchyma shows diffuse edema in there is peripancreatic edema/inflammation. Main pancreatic duct is mildly distended. Pancreas enhances throughout. No focal mass lesion within the pancreatic parenchyma. Spleen: No splenomegaly. No focal mass lesion. Adrenals/Urinary Tract: No adrenal nodule or mass. Kidneys are unremarkable. Stomach/Bowel: Stomach is nondistended. No gastric wall thickening. No evidence of outlet obstruction. Duodenum is normally positioned as is the ligament of Treitz. Visualize small bowel loops and colonic segments of the abdomen are unremarkable. Vascular/Lymphatic: No evidence for abdominal aortic aneurysm. Other: Edema is identified in the anterior pararenal space. Musculoskeletal: No abnormal marrow enhancement within the visualized bony anatomy. IMPRESSION: 1. Edema within and around the pancreatic parenchyma, consistent with pancreatitis. Pancreatic parenchyma enhances throughout. 2. Cholelithiasis without choledocholithiasis. 3. Mild extrahepatic biliary duct dilatation. Electronically Signed   By: [name] M.D.   On: 01/22/2017 10:14   US Abdomen Limited  Ruq  Result Date: 01/22/2017 CLINICAL DATA:  61 year old male with right upper quadrant abdominal pain and vomiting. EXAM: US ABDOMEN LIMITED - RIGHT UPPER QUADRANT COMPARISON:  Abdominal ultrasound dated 01/14/2017 FINDINGS: Gallbladder: The gallbladder is mildly distended. There multiple stones within the gallbladder. No gallbladder wall thickening or pericholecystic fluid. Positive sonographic Murphy's sign. Common bile duct: Diameter: 9 mm (previously 5 mm). A 3 mm faint echogenic focus in the central CBD may represent a stone. Liver: Diffuse fatty infiltration of the liver. IMPRESSION: 1. Cholelithiasis with positive sonographic Murphy's sign and mild dilatation of the CBD. A 3 mm central CBD stone may be present. There is however no gallbladder wall thickening or pericholecystic fluid or other definite sonographic features of acute cholecystitis. A hepatobiliary scintigraphy may provide better evaluation of the gallbladder if there is a high clinical concern for acute cholecystitis . 2. Fatty liver. Electronically Signed   By: Elgie Collard M.D.   On: 01/22/2017 03:51   US Abdomen Limited Ruq  Result Date: 01/14/2017 CLINICAL DATA:  Abdominal pain. EXAM: US ABDOMEN LIMITED - RIGHT UPPER QUADRANT FINDINGS: Gallbladder: Multiple gallstones. Gallbladder wall thickness 3 mm. Negative Murphy sign. Common bile duct: Diameter: 5 mm Liver: Increased echogenicity suggesting fatty infiltration and/or hepatocellular disease. No focal hepatic abnormality identified. IMPRESSION: 1. Multiple gallstones. Gallbladder wall thickness 3 mm. Negative Murphy sign. No biliary distention. 2. Increased echogenicity of the liver suggesting fatty infiltration and/or pedis 80 disease. Electronically Signed   By: Maisie Fus  Register   On: 01/14/2017 08:17       Today   Subjective:   Barry Wilson  Patient's abdominal pain is resolved  Objective:   Blood pressure 126/63, pulse 64, temperature 98.2 F (36.8 C),  temperature source Oral, resp. rate 18, height 5\' 6"  (1.676 m), weight 198 lb 3.2 oz (89.9 kg), SpO2 95 %.  .  Intake/Output Summary (Last 24 hours) at 01/25/17 1437 Last data filed at 01/25/17 0545  Gross per 24 hour  Intake              723 ml  Output              250 ml  Net              473 ml    Exam VITAL SIGNS: Blood pressure 126/63, pulse 64, temperature 98.2 F (36.8 C), temperature source Oral, resp. rate 18, height 5\' 6"  (1.676 m), weight 198 lb 3.2 oz (89.9 kg), SpO2 95 %.  GENERAL:  61 y.o.-year-old patient lying in the bed with no acute distress.  EYES: Pupils equal, round, reactive to light and accommodation. No scleral icterus. Extraocular muscles intact.  HEENT: Head atraumatic, normocephalic. Oropharynx and nasopharynx clear.  NECK:  Supple, no jugular venous distention. No thyroid enlargement, no tenderness.  LUNGS: Normal breath sounds bilaterally, no wheezing, rales,rhonchi or crepitation. No use of accessory muscles of respiration.  CARDIOVASCULAR: S1, S2 normal. No murmurs, rubs, or gallops.  ABDOMEN: Soft, nontender, nondistended. Bowel sounds present. No organomegaly or mass.  EXTREMITIES: No pedal edema, cyanosis, or clubbing.  NEUROLOGIC: Cranial nerves II through XII are intact. Muscle strength 5/5 in all extremities. Sensation intact. Gait not checked.  PSYCHIATRIC: The patient is alert and oriented x 3.  SKIN: No obvious rash, lesion, or ulcer.   Data Review     CBC w Diff: Lab Results  Component Value Date   WBC 8.6 01/24/2017   HGB 11.4 (L) 01/24/2017   HCT 33.3 (L) 01/24/2017   PLT 140 (L) 01/24/2017   LYMPHOPCT 10 01/22/2017   MONOPCT 8 01/22/2017   EOSPCT 1 01/22/2017   BASOPCT 0 01/22/2017   CMP: Lab Results  Component Value Date   NA 138 01/24/2017   K 4.4 01/24/2017   CL 109 01/24/2017   CO2 25 01/24/2017   BUN 17 01/24/2017   CREATININE 0.94 01/24/2017   PROT 5.9 (L) 01/24/2017   ALBUMIN 3.1 (L) 01/24/2017   BILITOT 1.3 (H)  01/24/2017   ALKPHOS 49 01/24/2017   AST 81 (H) 01/24/2017   ALT 78 (H) 01/24/2017  .  Micro Results Recent Results (from the past 240 hour(s))  Surgical pcr screen     Status: None   Collection Time: 01/23/17  9:00 AM  Result Value Ref Range Status   MRSA, PCR NEGATIVE NEGATIVE Final   Staphylococcus aureus NEGATIVE NEGATIVE Final    Comment:        The Xpert SA Assay (FDA approved for NASAL specimens in patients over 68 years of age), is one component of a comprehensive surveillance program.  Test performance has been validated by [facility] for patients greater than or equal to 73 year old. It is not intended to diagnose infection nor to guide or monitor treatment.         Code Status Orders        Start     Ordered   01/22/17 0728  Full code  Continuous     01/22/17 0727    Code Status History    Date Active Date Inactive Code Status Order ID Comments User Context   This patient has a current code status but no historical code status.          Follow-up Information    Henrene Dodge, MD. Go on 02/04/2017.   Specialty:  Surgery Why:  Thursday @ 2:30pm for hopital follow up. Contact information: [address] [place] [address] [phone]           Discharge Medications   Allergies as of 01/25/2017   No  Known Allergies     Medication List    STOP taking these medications   ranitidine 150 MG tablet Commonly known as:  ZANTAC     TAKE these medications   alum & mag hydroxide-simeth 200-200-20 MG/5ML suspension Commonly known as:  MAALOX/MYLANTA Take 30 mLs by mouth every 6 (six) hours as needed for indigestion or heartburn. Notes to patient:  Last administered 01/24/17   aspirin EC 81 MG tablet Take 81 mg by mouth daily.   docusate sodium 100 MG capsule Commonly known as:  COLACE Take 1 capsule (100 mg total) by mouth 2 (two) times daily. Notes to patient:  Last administered 01/25/17 morning   multivitamin with  minerals Tabs tablet Take 1 tablet by mouth daily. Notes to patient:  Last administered 01/25/17 morning   omeprazole 40 MG capsule Commonly known as:  PRILOSEC Take 40 mg by mouth daily. Notes to patient:  Received similar med pantoprazole 01/25/17 morning   ondansetron 4 MG tablet Commonly known as:  ZOFRAN Take 1 tablet (4 mg total) by mouth every 8 (eight) hours as needed for nausea or vomiting.   oxyCODONE 5 MG immediate release tablet Commonly known as:  Oxy IR/ROXICODONE Take 1 tablet (5 mg total) by mouth every 4 (four) hours as needed for moderate pain.   simethicone 80 MG chewable tablet Commonly known as:  MYLICON Chew 1 tablet (80 mg total) by mouth 4 (four) times daily as needed for flatulence.   simvastatin 40 MG tablet Commonly known as:  ZOCOR Take 40 mg by mouth daily. Notes to patient:  Last administered 01/24/17 evening          Total Time in preparing paper work, data evaluation and todays exam - 35 minutes  Auburn Bilberry M.D on 01/25/2017 at 2:37 PM  [facility]   Office  [phone]

## 2017-01-25 NOTE — Plan of Care (Signed)
Problem: Pain Managment: Goal: General experience of comfort will improve Outcome: Adequate for Discharge Minimal pain med use;

## 2017-01-25 NOTE — Discharge Instructions (Signed)
[facility] - [place] at [facility] ° °DIET:  °Low fat, Low cholesterol diet ° °DISCHARGE CONDITION:  °Stable ° °ACTIVITY:  °Activity as tolerated ° °OXYGEN:  °Home Oxygen: No. °  °Oxygen Delivery: room air ° °DISCHARGE LOCATION:  °home  ° ° °ADDITIONAL DISCHARGE INSTRUCTION: ° ° °If you experience worsening of your admission symptoms, develop shortness of breath, life threatening emergency, suicidal or homicidal thoughts you must seek medical attention immediately by calling 911 or calling your MD immediately  if symptoms less severe. ° °You Must read complete instructions/literature along with all the possible adverse reactions/side effects for all the Medicines you take and that have been prescribed to you. Take any new Medicines after you have completely understood and accpet all the possible adverse reactions/side effects.  ° °Please note ° °You were cared for by a hospitalist during your hospital stay. If you have any questions about your discharge medications or the care you received while you were in the hospital after you are discharged, you can call the unit and asked to speak with the hospitalist on call if the hospitalist that took care of you is not available. Once you are discharged, your primary care physician will handle any further medical issues. Please note that NO REFILLS for any discharge medications will be authorized once you are discharged, as it is imperative that you return to your primary care physician (or establish a relationship with a primary care physician if you do not have one) for your aftercare needs so that they can reassess your need for medications and monitor your lab values. ° ° °

## 2017-01-26 LAB — SURGICAL PATHOLOGY

## 2017-02-04 ENCOUNTER — Ambulatory Visit (INDEPENDENT_AMBULATORY_CARE_PROVIDER_SITE_OTHER): Payer: BLUE CROSS/BLUE SHIELD | Admitting: Surgery

## 2017-02-04 ENCOUNTER — Encounter: Payer: Self-pay | Admitting: Surgery

## 2017-02-04 VITALS — BP 154/84 | HR 70 | Temp 97.4°F | Ht 66.0 in | Wt 189.8 lb

## 2017-02-04 DIAGNOSIS — Z09 Encounter for follow-up examination after completed treatment for conditions other than malignant neoplasm: Secondary | ICD-10-CM

## 2017-02-04 MED ORDER — OMEPRAZOLE 40 MG PO CPDR: 40.0000 mg | DELAYED_RELEASE_CAPSULE | Freq: Every day | ORAL | 0 refills | Status: AC

## 2017-02-04 NOTE — Progress Notes (Signed)
02/04/2017  HPI: Patient is s/p laparoscopic cholecystectomy on 3/24 with Dr. Everlene Farrier.  He presents for follow up.  Reports feeling well without much pain.  Reports some nausea at night time, but otherwise no issues.  Vital signs: BP (!) 154/84   Pulse 70   Temp 97.4 F (36.3 C) (Oral)   Ht  (1.676 m)   Wt 86.1 kg (189 lb 12.8 oz)   BMI 30.63 kg/m    Physical Exam: Constitutional: No acute distress Abdomen:  Incision clean, dry, intact.  Abdomen soft, nondistended, nontender to palpation.  No evidence of infection  Assessment/Plan: 61 yo male s/p laparoscopic cholecystectomy.  --Reassured the patient that he's healing well without issues.  He likely is having acid reflux and will be given a new prescription for Prilosec.   --Pathology reviewed with patient. --Patient may follow up on an as needed basis.   Howie Ill, MD [facility]

## 2017-02-04 NOTE — Patient Instructions (Addendum)
We have sent your Omeprazole request to your pharmacy.  Please call our office if you have questions or concerns.

## 2017-02-10 ENCOUNTER — Telehealth: Payer: Self-pay

## 2017-02-10 NOTE — Telephone Encounter (Signed)
Disability Paperwork has been received and a $25.00 collection fee has been obtained.  

## 2017-02-10 NOTE — Telephone Encounter (Signed)
Called patient to let him know that his disability form was filled out and left at the front desk to be picked up.

## 2017-09-16 IMAGING — MR MR ABDOMEN WO/W CM MRCP
8 of 20 series · 15 of 48 positions shown · IV contrast (15ML MULTIHANCE)
Comparison: Ultrasound exam from earlier today.

CLINICAL DATA: Cholelithiasis with right upper quadrant pain and
potential common bile duct stones seen on ultrasound.

EXAM:
MRI ABDOMEN WITHOUT AND WITH CONTRAST (INCLUDING MRCP)
TECHNIQUE: Multiplanar multisequence MR imaging of the abdomen was performed
both before and after the administration of intravenous contrast.
Heavily T2-weighted images of the biliary and pancreatic ducts were
obtained, and three-dimensional MRCP images were rendered by post
processing.
CONTRAST:  15mL MULTIHANCE GADOBENATE DIMEGLUMINE 529 MG/ML IV SOLN

[Series 2: cor true fisp · coronal · 5.5mm · 0.78mm/px · 1 of 39 slices shown]
[im 1/39]
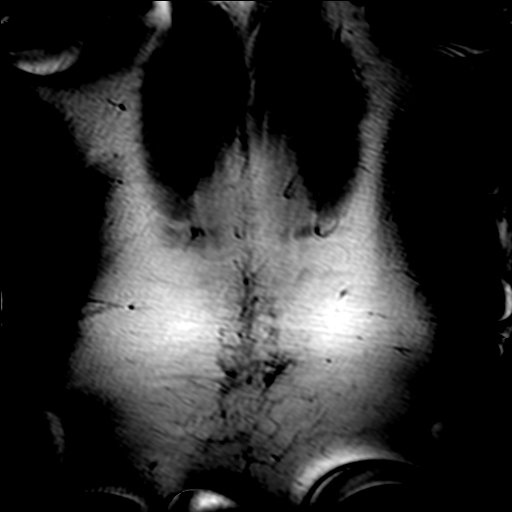

[Series 3: T2 fat-sat · axial · 7.0mm · 0.78mm/px · 1 of 30 slices shown]
[im 1/30]
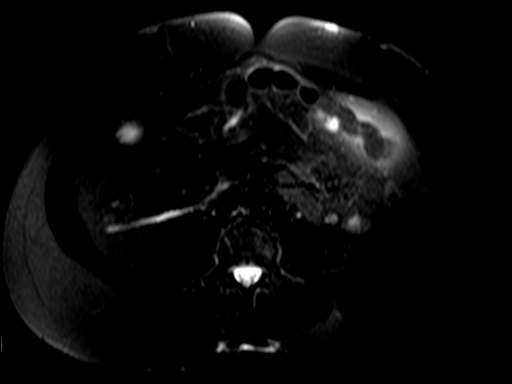

[Series 4: T2 · axial · 7.0mm · 1.48mm/px · 1 of 30 slices shown]
[im 1/30]
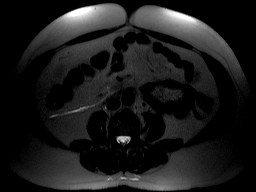

[Series 5: axial in-out of · axial · 7.0mm · 0.74mm/px · z∈[-78,+166]mm · 3 of 60 slices shown]
[im 1/60]
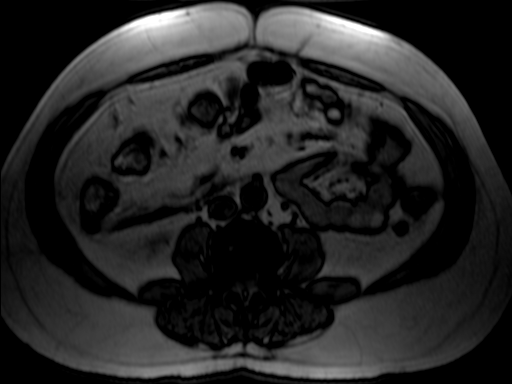
[im 30/60]
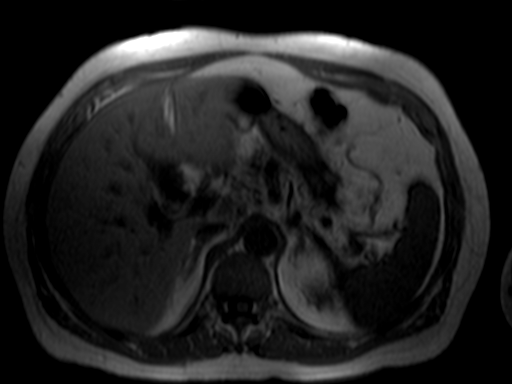
[im 60/60]
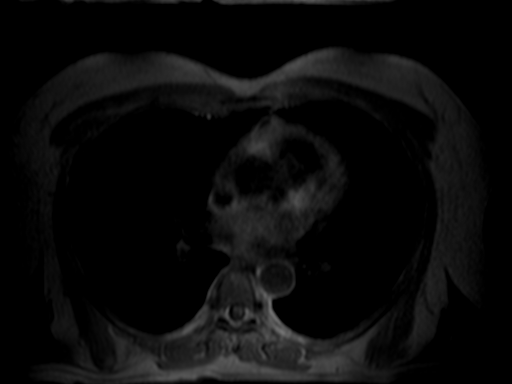

[Series 9: cor thins · coronal · 3.0mm · 0.73mm/px · 1 of 25 slices shown]
[im 1/25]
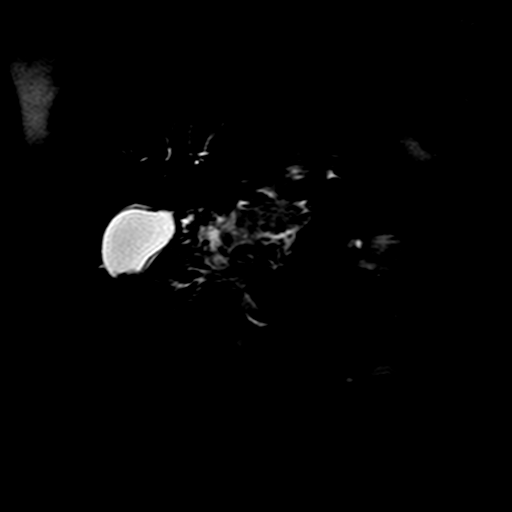

[Series 10: MRCP · coronal · 40.0mm · 0.91mm/px · 1 of 6 slices shown]
[im 1/6]
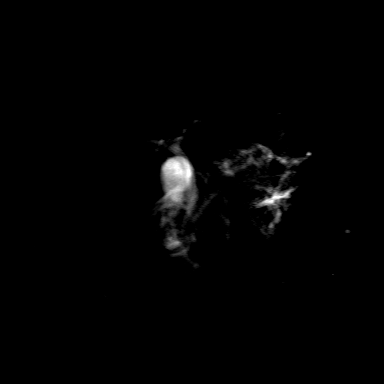

[Series 13: DWI · axial · 6.5mm · 2.97mm/px · z∈[-74,+167]mm · 5 of 95 slices shown]
[im 1/95]
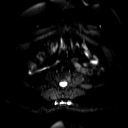
[im 24/95]
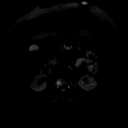
[im 48/95]
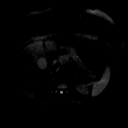
[im 71/95]
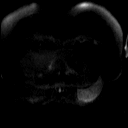
[im 95/95]
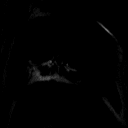

[Series 14: axial dwi_adc · axial · 6.5mm · 2.97mm/px · z∈[-74,+167]mm · 2 of 32 slices shown]
[im 1/32]
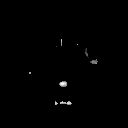
[im 32/32]
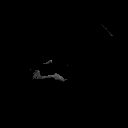

[15 of 48 positions shown; findings below may reference images not displayed]

FINDINGS: Lower chest:  Dependent atelectasis.

Hepatobiliary: No focal abnormality in the liver parenchyma.
Geographic fatty deposition noted within the liver parenchyma.
Gallbladder is distended with tiny gallstones evident. Trace
prominence intrahepatic bile ducts is associated with 10 mm diameter
extrahepatic common duct. Common bile duct in the head of the
pancreas measures 7-8 mm. No filling defect in the common duct or
common bile duct to suggest choledocholithiasis.

Pancreas: Pancreatic parenchyma shows diffuse edema in there is
peripancreatic edema/inflammation. Main pancreatic duct is mildly
distended. Pancreas enhances throughout. No focal mass lesion within
the pancreatic parenchyma.

Spleen: No splenomegaly. No focal mass lesion.

Adrenals/Urinary Tract: No adrenal nodule or mass. Kidneys are
unremarkable.

Stomach/Bowel: Stomach is nondistended. No gastric wall thickening.
No evidence of outlet obstruction. Duodenum is normally positioned
as is the ligament of Treitz. Visualize small bowel loops and
colonic segments of the abdomen are unremarkable.

Vascular/Lymphatic: No evidence for abdominal aortic aneurysm.

Other: Edema is identified in the anterior pararenal space.

Musculoskeletal: No abnormal marrow enhancement within the
visualized bony anatomy.
IMPRESSION: 1. Edema within and around the pancreatic parenchyma, consistent
with pancreatitis. Pancreatic parenchyma enhances throughout.
2. Cholelithiasis without choledocholithiasis.
3. Mild extrahepatic biliary duct dilatation.

## 2017-11-26 ENCOUNTER — Encounter: Payer: Self-pay | Admitting: Emergency Medicine

## 2017-11-26 ENCOUNTER — Other Ambulatory Visit: Payer: Self-pay

## 2017-11-26 ENCOUNTER — Ambulatory Visit
Admission: EM | Admit: 2017-11-26 | Discharge: 2017-11-26 | Disposition: A | Discharge status: Home / Self Care | Payer: BLUE CROSS/BLUE SHIELD | Attending: Family Medicine | Admitting: Family Medicine

## 2017-11-26 DIAGNOSIS — R05 Cough: Secondary | ICD-10-CM

## 2017-11-26 DIAGNOSIS — H6501 Acute serous otitis media, right ear: Secondary | ICD-10-CM

## 2017-11-26 MED ORDER — AMOXICILLIN 875 MG PO TABS: 875.0000 mg | ORAL_TABLET | Freq: Two times a day (BID) | ORAL | 0 refills | Status: AC

## 2017-11-26 NOTE — ED Triage Notes (Signed)
Patient in today c/o right ear fullness and ringing since last night/early this morning. Patient states he has had a cold x 3 days. Patient tried flushing ear with peroxide without relief.

## 2017-11-26 NOTE — ED Provider Notes (Signed)
[facility]    CSN: [id number] Arrival date & time: 11/26/17  [id number]     History   Chief Complaint Chief Complaint  Patient presents with  . Otalgia  . Tinnitus    HPI Barry AreaRobert L Yahnke Jr. is a 62 y.o. male.   The history is provided by the patient.  Otalgia  Location:  Right Behind ear:  No abnormality Quality:  Aching and throbbing Severity:  Moderate Onset quality:  Sudden Duration:  1 day Timing:  Constant Progression:  Worsening Chronicity:  New Context: recent URI   Relieved by:  None tried Ineffective treatments:  None tried Associated symptoms: congestion, cough, rhinorrhea and tinnitus   Associated symptoms: no diarrhea, no ear discharge, no fever, no headaches, no hearing loss, no neck pain, no rash, no sore throat and no vomiting   Risk factors: no recent travel, no chronic ear infection and no prior ear surgery   URI  Presenting symptoms: congestion, cough, ear pain and rhinorrhea   Presenting symptoms: no fever and no sore throat   Associated symptoms: no headaches and no neck pain     Past Medical History:  Diagnosis Date  . Hypercholesteremia   . Hypercholesterolemia     Patient Active Problem List   Diagnosis Date Noted  . Gallstone pancreatitis 01/22/2017  . Acute biliary pancreatitis   . Hypercholesterolemia 05/14/2015    Past Surgical History:  Procedure Laterality Date  . CHOLECYSTECTOMY N/A 01/23/2017   Procedure: LAPAROSCOPIC CHOLECYSTECTOMY;  Surgeon: Leafy Roiego F Pabon, MD;  Location: [facility] ORS;  Service: General;  Laterality: N/A;  . COLONOSCOPY WITH PROPOFOL N/A 08/09/2015   Procedure: COLONOSCOPY WITH PROPOFOL;  Surgeon: Wallace CullensPaul Y Oh, MD;  Location: [facility];  Service: Gastroenterology;  Laterality: N/A;  . ROTATOR CUFF REPAIR Left        Home Medications    Prior to Admission medications   Medication Sig Start Date End Date Taking? Authorizing Provider  aspirin EC 81 MG tablet Take 81 mg by mouth daily.   Yes  [provider]  Multiple Vitamin (MULTIVITAMIN) tablet Take 1 tablet by mouth daily.   Yes [provider]  omeprazole (PRILOSEC) 40 MG capsule Take 1 capsule (40 mg total) by mouth daily. 02/04/17  Yes Piscoya, Elita QuickJose, MD  simvastatin (ZOCOR) 40 MG tablet Take 40 mg by mouth daily.   Yes [provider]  amoxicillin (AMOXIL) 875 MG tablet Take 1 tablet (875 mg total) by mouth 2 (two) times daily. 11/26/17   Payton Mccallumonty, Emillee Talsma, MD    Family History Family History  Problem Relation Age of Onset  . Osteoarthritis Mother   . Thyroid disease Mother   . Hypotension Mother   . Emphysema Father   . Cancer Father        lung and liver  . Hypertension Father   . Hyperlipidemia Father     Social History Social History   Tobacco Use  . Smoking status: Never Smoker  . Smokeless tobacco: Never Used  Substance Use Topics  . Alcohol use: Yes    Comment: occasional  . Drug use: No     Allergies   Patient has no known allergies.   Review of Systems Review of Systems  Constitutional: Negative for fever.  HENT: Positive for congestion, ear pain, rhinorrhea and tinnitus. Negative for ear discharge, hearing loss and sore throat.   Respiratory: Positive for cough.   Gastrointestinal: Negative for diarrhea and vomiting.  Musculoskeletal: Negative for neck pain.  Skin: Negative  for rash.  Neurological: Negative for headaches.     Physical Exam Triage Vital Signs ED Triage Vitals [11/26/17 0906]  Enc Vitals Group     BP 140/71     Pulse Rate 69     Resp 16     Temp 98.2 F (36.8 C)     Temp Source Oral     SpO2 97 %     Weight 193 lb (87.5 kg)     Height 5\' 6"  (1.676 m)     Head Circumference      Peak Flow      Pain Score 5     Pain Loc      Pain Edu?      Excl. in GC?    No data found.  Updated Vital Signs BP 140/71 (BP Location: Left Arm)   Pulse 69   Temp 98.2 F (36.8 C) (Oral)   Resp 16   Ht 5\' 6"  (1.676 m)   Wt 193 lb (87.5 kg)   SpO2  97%   BMI 31.15 kg/m   Visual Acuity Right Eye Distance:   Left Eye Distance:   Bilateral Distance:    Right Eye Near:   Left Eye Near:    Bilateral Near:     Physical Exam  Constitutional: He appears well-developed and well-nourished. No distress.  HENT:  Head: Normocephalic and atraumatic.  Right Ear: External ear and ear canal normal. Tympanic membrane is erythematous and bulging. A middle ear effusion is present.  Left Ear: Tympanic membrane, external ear and ear canal normal.  Nose: Rhinorrhea present.  Mouth/Throat: Uvula is midline, oropharynx is clear and moist and mucous membranes are normal. No oropharyngeal exudate or tonsillar abscesses.  Eyes: Conjunctivae are normal. Right eye exhibits no discharge. Left eye exhibits no discharge. No scleral icterus.  Neck: Normal range of motion. Neck supple.  Cardiovascular: Normal rate.  Pulmonary/Chest: Effort normal. No respiratory distress.  Neurological: He is alert.  Skin: Skin is warm and dry. No rash noted. He is not diaphoretic.  Nursing note and vitals reviewed.    UC Treatments / Results  Labs (all labs ordered are listed, but only abnormal results are displayed) Labs Reviewed - No data to display  EKG  EKG Interpretation None       Radiology No results found.  Procedures Procedures (including critical care time)  Medications Ordered in UC Medications - No data to display   Initial Impression / Assessment and Plan / UC Course  I have reviewed the triage vital signs and the nursing notes.  Pertinent labs & imaging results that were available during my care of the patient were reviewed by me and considered in my medical decision making (see chart for details).       Final Clinical Impressions(s) / UC Diagnoses   Final diagnoses:  Right acute serous otitis media, recurrence not specified    ED Discharge Orders        Ordered    amoxicillin (AMOXIL) 875 MG tablet  2 times daily     11/26/17  0937     1. diagnosis reviewed with patient 2. rx as per orders above; reviewed possible side effects, interactions, risks and benefits  3. Recommend supportive treatment with otc analgesics prn  4. Follow-up prn if symptoms worsen or don't improve  Controlled Substance Prescriptions [place] Controlled Substance Registry consulted? Not Applicable   Payton Mccallum, MD 11/26/17 [phone]

## 2017-11-29 ENCOUNTER — Telehealth: Payer: Self-pay | Admitting: *Deleted

## 2017-11-29 NOTE — Telephone Encounter (Signed)
Called patient, verified DOB, patient reported feeling 100% better. Advised patient to follow up with PCP or [facility] if symptoms return.

## 2020-06-12 ENCOUNTER — Ambulatory Visit: Payer: BLUE CROSS/BLUE SHIELD | Admitting: Dermatology

## 2022-10-06 ENCOUNTER — Other Ambulatory Visit: Payer: Self-pay | Admitting: Internal Medicine

## 2022-10-06 DIAGNOSIS — R7989 Other specified abnormal findings of blood chemistry: Secondary | ICD-10-CM

## 2022-10-19 ENCOUNTER — Ambulatory Visit
Admission: RE | Admit: 2022-10-19 | Discharge: 2022-10-19 | Disposition: A | Discharge status: Home / Self Care | Payer: BC Managed Care – PPO | Source: Ambulatory Visit | Attending: Internal Medicine | Admitting: Internal Medicine

## 2022-10-19 DIAGNOSIS — R7989 Other specified abnormal findings of blood chemistry: Secondary | ICD-10-CM | POA: Insufficient documentation
